# Patient Record
Sex: Male | Born: 1955 | Race: White | Hispanic: No | Marital: Married | Smoking: Never smoker
Health system: Southern US, Community
[De-identification: ages and names within clinical notes are randomized; demographics above are authoritative.]

## PROBLEM LIST (undated history)

## (undated) DIAGNOSIS — M1990 Unspecified osteoarthritis, unspecified site: Secondary | ICD-10-CM

## (undated) DIAGNOSIS — R55 Syncope and collapse: Secondary | ICD-10-CM

## (undated) DIAGNOSIS — T4145XA Adverse effect of unspecified anesthetic, initial encounter: Secondary | ICD-10-CM

## (undated) DIAGNOSIS — T8859XA Other complications of anesthesia, initial encounter: Secondary | ICD-10-CM

## (undated) DIAGNOSIS — G4762 Sleep related leg cramps: Secondary | ICD-10-CM

## (undated) DIAGNOSIS — I071 Rheumatic tricuspid insufficiency: Secondary | ICD-10-CM

## (undated) DIAGNOSIS — I5189 Other ill-defined heart diseases: Secondary | ICD-10-CM

## (undated) DIAGNOSIS — I10 Essential (primary) hypertension: Secondary | ICD-10-CM

## (undated) DIAGNOSIS — I519 Heart disease, unspecified: Secondary | ICD-10-CM

## (undated) HISTORY — PX: APPENDECTOMY: SHX54

## (undated) HISTORY — PX: KNEE ARTHROSCOPY: SUR90

## (undated) HISTORY — PX: HERNIA REPAIR: SHX51

## (undated) HISTORY — PX: TONSILLECTOMY: SUR1361

---

## 2005-10-06 ENCOUNTER — Ambulatory Visit (HOSPITAL_COMMUNITY): Admission: RE | Admit: 2005-10-06 | Discharge: 2005-10-06 | Payer: Self-pay | Admitting: *Deleted

## 2005-10-06 ENCOUNTER — Encounter (INDEPENDENT_AMBULATORY_CARE_PROVIDER_SITE_OTHER): Payer: Self-pay | Admitting: Specialist

## 2008-07-20 ENCOUNTER — Encounter (INDEPENDENT_AMBULATORY_CARE_PROVIDER_SITE_OTHER): Payer: Self-pay | Admitting: *Deleted

## 2008-07-20 ENCOUNTER — Ambulatory Visit (HOSPITAL_COMMUNITY): Admission: RE | Admit: 2008-07-20 | Discharge: 2008-07-20 | Payer: Self-pay | Admitting: *Deleted

## 2010-02-23 ENCOUNTER — Encounter: Admission: RE | Admit: 2010-02-23 | Discharge: 2010-02-23 | Payer: Self-pay | Admitting: Family Medicine

## 2010-08-18 IMAGING — CT CT ABD-PELV W/ CM
2 of 5 series · 17 of 46 positions shown, 19 images · IV contrast (30CC OMNI 300 & [ID] OMNI 350)
Comparison: None.

CLINICAL DATA: Right upper quadrant pain and right flank pain.
Rule out stone.

CT ABDOMEN AND PELVIS WITH CONTRAST
TECHNIQUE: Multidetector CT imaging of the abdomen and pelvis was
performed following the standard protocol during bolus
administration of intravenous contrast.
Contrast: 100 ml [DATE] and IV.

[Series 2: abdomen w/ · axial · 0.77mm/px · z∈[-420,-25]mm · 14 of 89 slices shown, 16 images]
[im 5/89  soft-tissue]
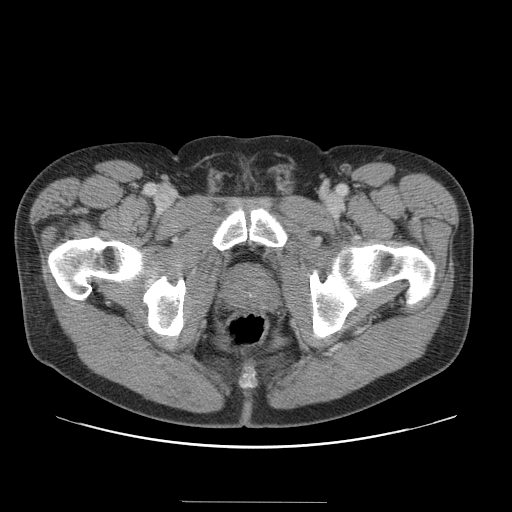
[im 5/89  bone]
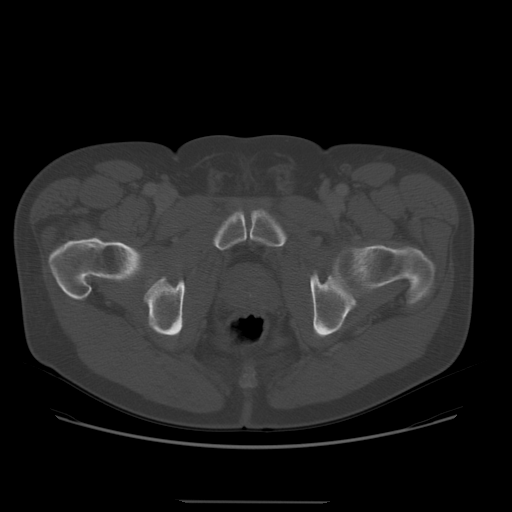
[im 10/89  soft-tissue]
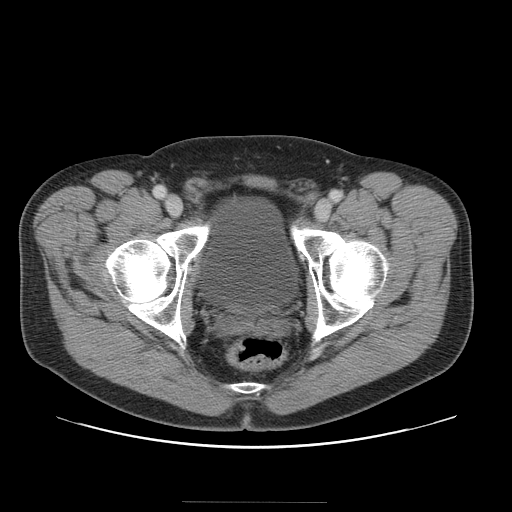
[im 19/89  soft-tissue]
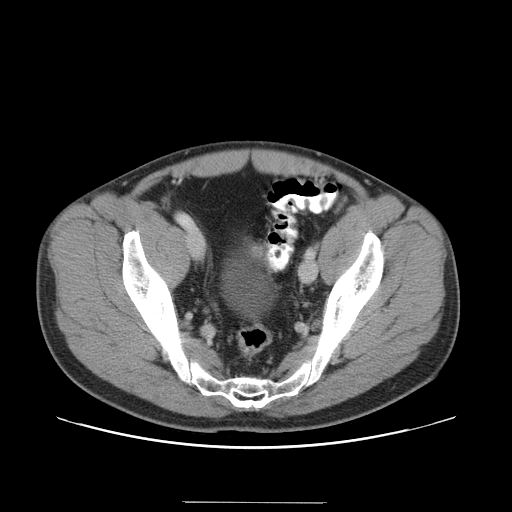
[im 24/89  soft-tissue]
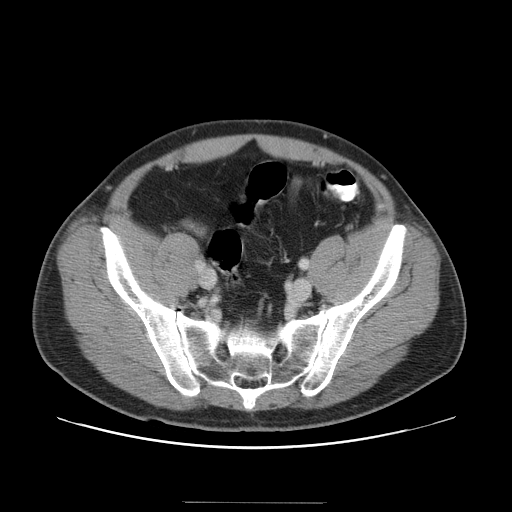
[im 28/89  soft-tissue]
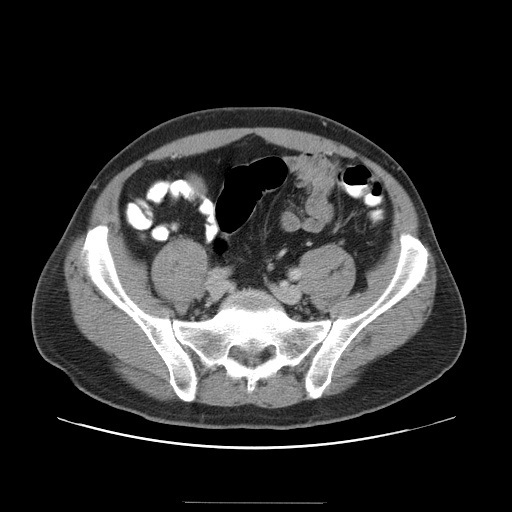
[im 38/89  soft-tissue]
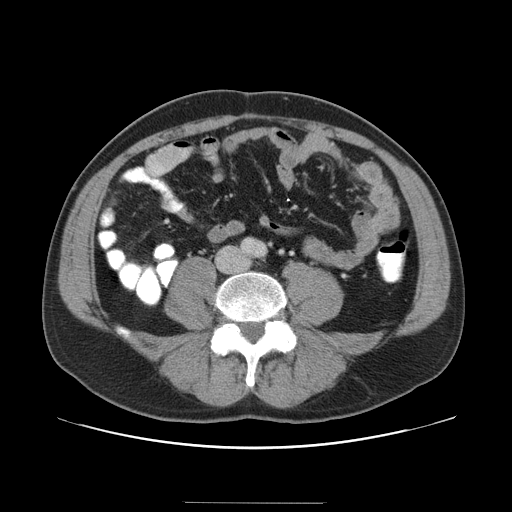
[im 42/89  soft-tissue]
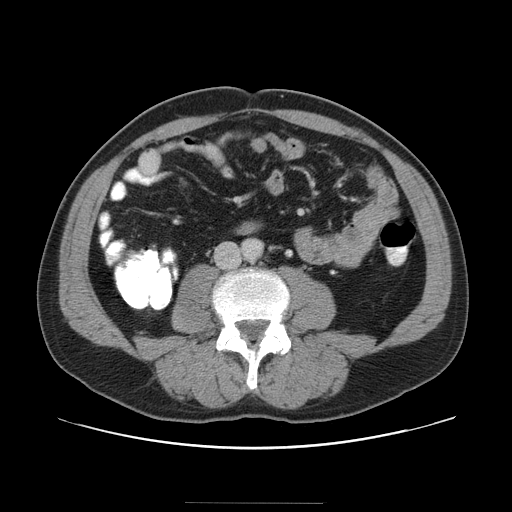
[im 47/89  soft-tissue]
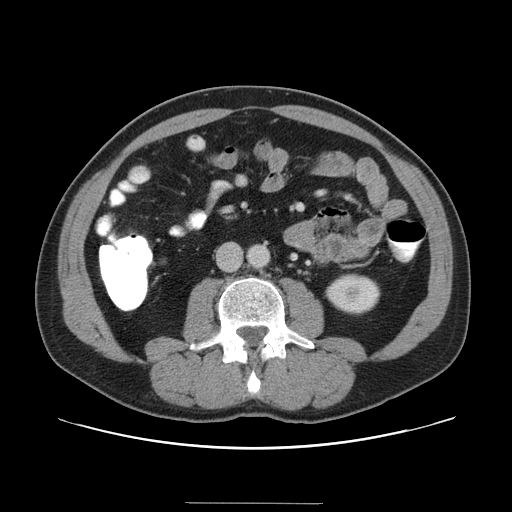
[im 51/89  soft-tissue]
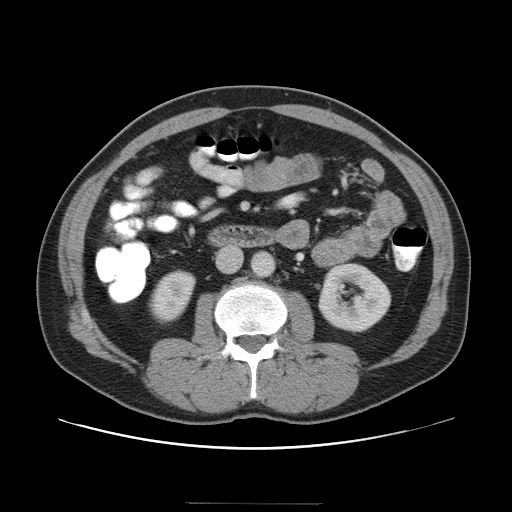
[im 51/89  bone]
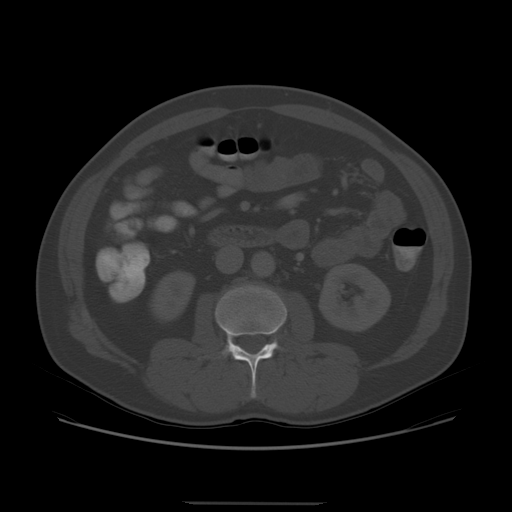
[im 61/89  soft-tissue]
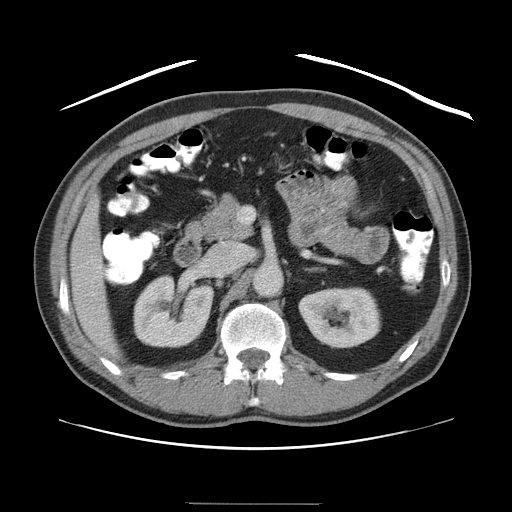
[im 65/89  soft-tissue]
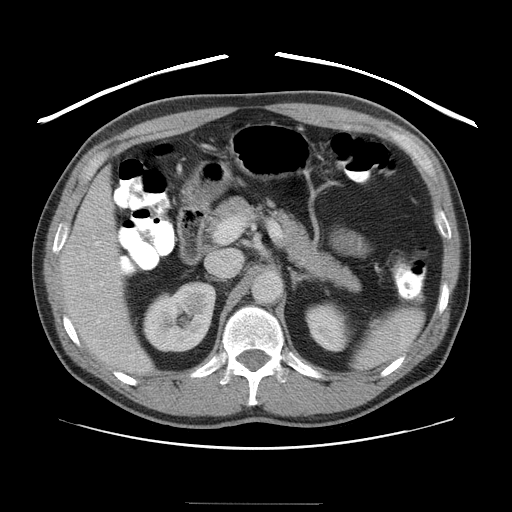
[im 70/89  soft-tissue]
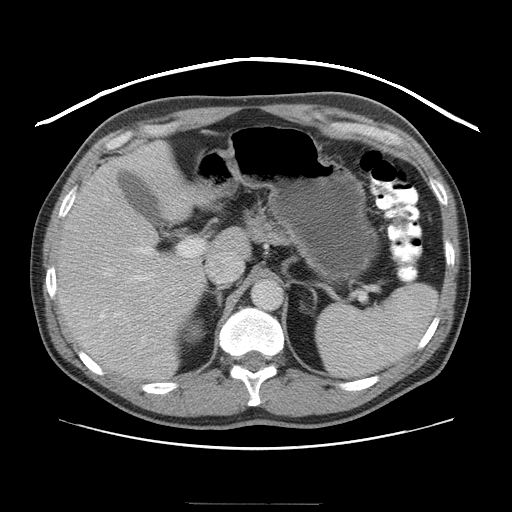
[im 79/89  soft-tissue]
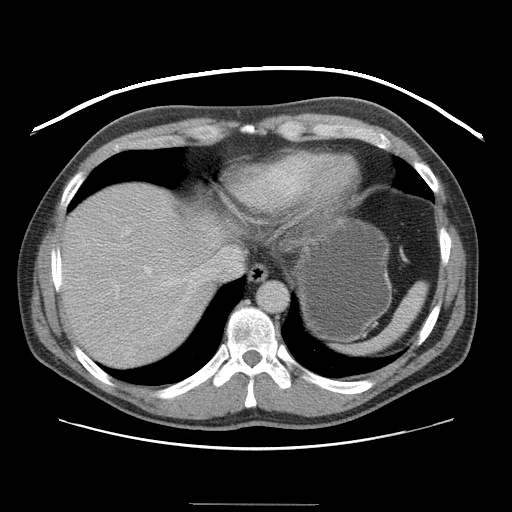
[im 84/89  soft-tissue]
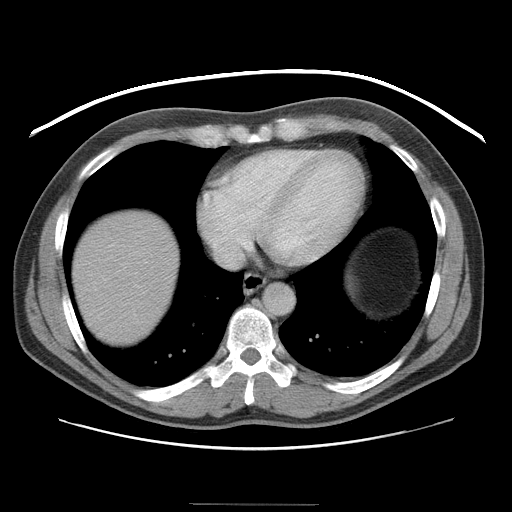

[Series 401: cor · coronal · 0.97mm/px · 3 of 116 slices shown]
[im 39/116  soft-tissue]
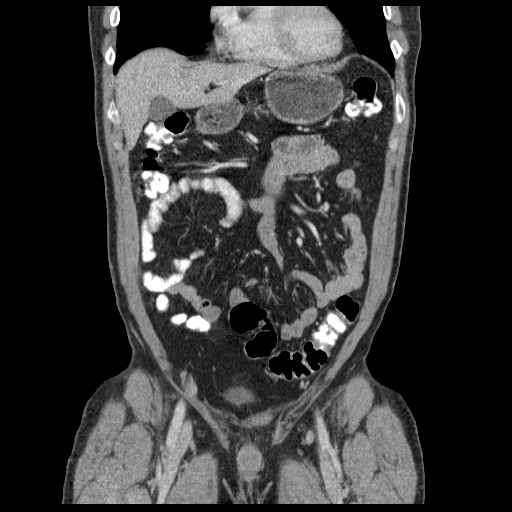
[im 52/116  soft-tissue]
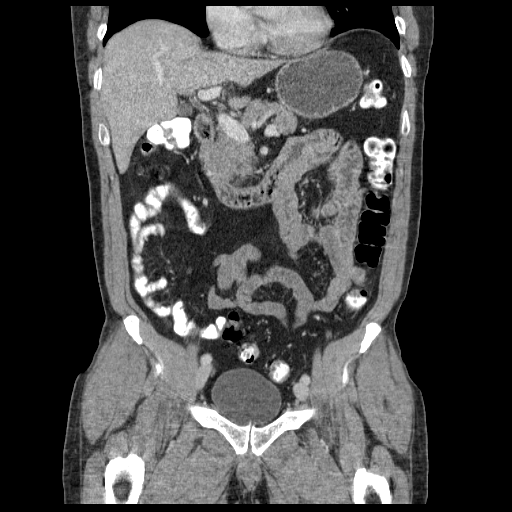
[im 64/116  soft-tissue]
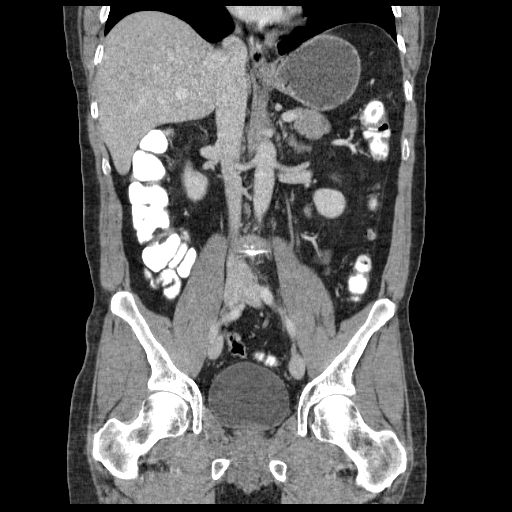

[17 of 46 positions shown; findings below may reference images not displayed]

FINDINGS: Unenhanced images were not obtained however no renal
calculi are identified.  There is no renal mass or obstruction.

The liver and gallbladder are normal.  Pancreas and spleen are
normal.  There is no bowel obstruction.  No mass or adenopathy is
present.  Mild sigmoid diverticulosis.  No acute bowel edema.
IMPRESSION: No acute abnormality.  No renal calculi or renal obstruction is
identified.

## 2010-12-04 HISTORY — PX: COLONOSCOPY: SHX174

## 2011-04-18 NOTE — Op Note (Signed)
Devin Campbell, Devin Campbell               ACCOUNT NO.:  [id number]   MEDICAL RECORD NO.:  [id number]          PATIENT TYPE:  AMB   LOCATION:  ENDO                         FACILITY:  [facility]   PHYSICIAN:  Georgiana Spinner, M.D.    DATE OF BIRTH:  10/10/1956   DATE OF PROCEDURE:  DATE OF DISCHARGE:                               OPERATIVE REPORT   PROCEDURE:  Colonoscopy.   INDICATIONS:  Colon cancer screening.   ANESTHESIA:  Fentanyl 125 mcg, Versed 10 mg.   PROCEDURE:  With the patient mildly sedated in the left lateral  decubitus position, a rectal exam was performed which was unremarkable.  Subsequently, the Pentax videoscopic colonoscope was inserted in the  rectum and passed under direct vision to the cecum, identified by  ileocecal valve and appendiceal orifice, both of which were  photographed.  From this point the colonoscope was slowly withdrawn  taking circumferential views of the colonic mucosa.  The prep was fairly  good.  There were some areas where there was vegetable material that had  to be suctioned as best we could, but no gross lesions were seen as we  withdrew all the way to the rectum and in the rectum was small polyp was  seen photographed, removed using hot biopsy forceps technique setting of  20/150 blended current.  The endoscope was then placed in retroflexion  to view the anal canal from above.  Internal hemorrhoids was seen and  photographed.  The endoscope was straightened and withdrawn.  The  patient's vital signs and pulse oximeter remained stable.  The patient  tolerated procedure well without apparent complication.   FINDINGS:  Internal hemorrhoids, very small polyp of rectum removed.  Await biopsy report.  The patient call me for results and follow-up with  me as needed as an outpatient.           ______________________________  Georgiana Spinner, M.D.     GMO/MEDQ  D:  07/20/2008  T:  07/20/2008  Job:  [id number]

## 2011-04-21 NOTE — Op Note (Signed)
NAMECATHAN, GEARIN               ACCOUNT NO.:  [id number]   MEDICAL RECORD NO.:  [id number]          PATIENT TYPE:  AMB   LOCATION:  ENDO                         FACILITY:  [facility]   PHYSICIAN:  Georgiana Spinner, M.D.    DATE OF BIRTH:  09-20-1956   DATE OF PROCEDURE:  10/06/2005  DATE OF DISCHARGE:                                 OPERATIVE REPORT   PROCEDURE:  Colonoscopy.   INDICATIONS:  Colon polyps.   ANESTHESIA:  Demerol 100, Versed 10 mg.   PROCEDURE:  The patient was mildly sedated in the left lateral decubitus  position, a rectal examination was performed which was unremarkable.  Subsequently, the Olympus videoscopic colonoscope was inserted into the  rectum and passed under direct vision. With pressure applied, we reached the  cecum identified by the ileocecal valve and appendiceal orifice both of  which were photographed. From this point, the colonoscope was slowly  withdrawn taking circumferential views of the colonic mucosa stopping to  photograph polyps first in the transverse colon which was removed using hot  biopsy forceps technique setting of 20/200 blended current. Next, we stopped  at approximately 20 cm from the anal verge which a second polyp was seen. It  too was photographed and it was removed using snare cautery technique with  the same setting. The tissue was suctioned into the colonoscope for  retrieval. The endoscope was then withdrawn to the rectum which appeared  normal on direct and retroflexed view. The endoscope was straightened and  withdrawn. The patient's vital signs and pulse oximeter remained stable. The  patient tolerated the procedure well without apparent complications.   FINDINGS:  Polyps as described above and transverse colon 20 cm from anal  verge, otherwise an unremarkable examination.   PLAN:  Await biopsy report. The patient will call me for results and follow-  up with me as an outpatient.           ______________________________  Georgiana Spinner, M.D.     GMO/MEDQ  D:  10/06/2005  T:  10/06/2005  Job:  [id number]

## 2015-06-04 DIAGNOSIS — R55 Syncope and collapse: Secondary | ICD-10-CM

## 2015-06-04 HISTORY — DX: Syncope and collapse: R55

## 2015-06-06 ENCOUNTER — Emergency Department (HOSPITAL_COMMUNITY): Payer: BLUE CROSS/BLUE SHIELD

## 2015-06-06 ENCOUNTER — Encounter (HOSPITAL_COMMUNITY): Payer: Self-pay | Admitting: Emergency Medicine

## 2015-06-06 ENCOUNTER — Emergency Department (HOSPITAL_COMMUNITY)
Admission: EM | Admit: 2015-06-06 | Discharge: 2015-06-06 | Disposition: A | Discharge status: Home / Self Care | Payer: BLUE CROSS/BLUE SHIELD | Attending: Emergency Medicine | Admitting: Emergency Medicine

## 2015-06-06 DIAGNOSIS — R55 Syncope and collapse: Secondary | ICD-10-CM | POA: Diagnosis not present

## 2015-06-06 DIAGNOSIS — Y9389 Activity, other specified: Secondary | ICD-10-CM | POA: Diagnosis not present

## 2015-06-06 DIAGNOSIS — W01198A Fall on same level from slipping, tripping and stumbling with subsequent striking against other object, initial encounter: Secondary | ICD-10-CM | POA: Diagnosis not present

## 2015-06-06 DIAGNOSIS — S8992XA Unspecified injury of left lower leg, initial encounter: Secondary | ICD-10-CM | POA: Insufficient documentation

## 2015-06-06 DIAGNOSIS — I10 Essential (primary) hypertension: Secondary | ICD-10-CM | POA: Diagnosis not present

## 2015-06-06 DIAGNOSIS — Y998 Other external cause status: Secondary | ICD-10-CM | POA: Diagnosis not present

## 2015-06-06 DIAGNOSIS — S0990XA Unspecified injury of head, initial encounter: Secondary | ICD-10-CM | POA: Insufficient documentation

## 2015-06-06 DIAGNOSIS — R42 Dizziness and giddiness: Secondary | ICD-10-CM | POA: Diagnosis not present

## 2015-06-06 DIAGNOSIS — S8991XA Unspecified injury of right lower leg, initial encounter: Secondary | ICD-10-CM | POA: Diagnosis not present

## 2015-06-06 DIAGNOSIS — R61 Generalized hyperhidrosis: Secondary | ICD-10-CM | POA: Insufficient documentation

## 2015-06-06 DIAGNOSIS — Y92002 Bathroom of unspecified non-institutional (private) residence single-family (private) house as the place of occurrence of the external cause: Secondary | ICD-10-CM | POA: Diagnosis not present

## 2015-06-06 HISTORY — DX: Essential (primary) hypertension: I10

## 2015-06-06 LAB — I-STAT TROPONIN, ED: Troponin i, poc: 0 ng/mL (ref 0.00–0.08)

## 2015-06-06 LAB — APTT: aPTT: 23 s — ABNORMAL LOW (ref 24–37)

## 2015-06-06 LAB — DIFFERENTIAL
Basophils Absolute: 0 K/uL (ref 0.0–0.1)
Basophils Relative: 0 % (ref 0–1)
Eosinophils Absolute: 0 K/uL (ref 0.0–0.7)
Eosinophils Relative: 1 % (ref 0–5)
Lymphocytes Relative: 19 % (ref 12–46)
Lymphs Abs: 0.8 K/uL (ref 0.7–4.0)
Monocytes Absolute: 0.2 K/uL (ref 0.1–1.0)
Monocytes Relative: 6 % (ref 3–12)
Neutro Abs: 3.3 K/uL (ref 1.7–7.7)
Neutrophils Relative %: 74 % (ref 43–77)

## 2015-06-06 LAB — CBC
HCT: 38.3 % — ABNORMAL LOW (ref 39.0–52.0)
Hemoglobin: 12.9 g/dL — ABNORMAL LOW (ref 13.0–17.0)
MCH: 29.4 pg (ref 26.0–34.0)
MCHC: 33.7 g/dL (ref 30.0–36.0)
MCV: 87.2 fL (ref 78.0–100.0)
Platelets: 228 10*3/uL (ref 150–400)
RBC: 4.39 MIL/uL (ref 4.22–5.81)
RDW: 14.8 % (ref 11.5–15.5)
WBC: 4.4 10*3/uL (ref 4.0–10.5)

## 2015-06-06 LAB — I-STAT CHEM 8, ED
BUN: 22 mg/dL — ABNORMAL HIGH (ref 6–20)
Calcium, Ion: 1.23 mmol/L (ref 1.12–1.23)
Chloride: 105 mmol/L (ref 101–111)
Creatinine, Ser: 0.9 mg/dL (ref 0.61–1.24)
Glucose, Bld: 101 mg/dL — ABNORMAL HIGH (ref 65–99)
HCT: 40 % (ref 39.0–52.0)
Hemoglobin: 13.6 g/dL (ref 13.0–17.0)
Potassium: 4.3 mmol/L (ref 3.5–5.1)
Sodium: 141 mmol/L (ref 135–145)
TCO2: 23 mmol/L (ref 0–100)

## 2015-06-06 LAB — COMPREHENSIVE METABOLIC PANEL
ALT: 18 U/L (ref 17–63)
AST: 19 U/L (ref 15–41)
Albumin: 3.7 g/dL (ref 3.5–5.0)
Alkaline Phosphatase: 64 U/L (ref 38–126)
Anion gap: 7 (ref 5–15)
BUN: 16 mg/dL (ref 6–20)
CO2: 26 mmol/L (ref 22–32)
Calcium: 8.7 mg/dL — ABNORMAL LOW (ref 8.9–10.3)
Chloride: 106 mmol/L (ref 101–111)
Creatinine, Ser: 1.01 mg/dL (ref 0.61–1.24)
GFR calc Af Amer: >60 mL/min (ref >60)
GFR calc non Af Amer: >60 mL/min (ref >60)
Glucose, Bld: 109 mg/dL — ABNORMAL HIGH (ref 65–99)
Potassium: 4.3 mmol/L (ref 3.5–5.1)
Sodium: 139 mmol/L (ref 135–145)
Total Bilirubin: 0.3 mg/dL (ref 0.3–1.2)
Total Protein: 6.1 g/dL — ABNORMAL LOW (ref 6.5–8.1)

## 2015-06-06 LAB — CBG MONITORING, ED: Glucose-Capillary: 96 mg/dL (ref 65–99)

## 2015-06-06 LAB — PROTIME-INR
INR: 0.99 (ref 0.00–1.49)
Prothrombin Time: 13.3 seconds (ref 11.6–15.2)

## 2015-06-06 NOTE — ED Provider Notes (Signed)
CSN: [id number]     Arrival date & time 06/06/15  1740 History   First MD Initiated Contact with Patient 06/06/15 1821     Chief Complaint  Patient presents with  . Loss of Consciousness     (Consider location/radiation/quality/duration/timing/severity/associated sxs/prior Treatment) HPI   59 year old male with history of hypertension brought here via EMS from home for evaluation of a syncopal episode. Patient reported earlier that day he was having cramping bloating sensation after eating [employer]. He went to use the bathroom to have a bowel movement. Patient denies having to strain and did not produce a BM.  While in the bathroom pt report feeling dizzy with room spinning sensation, became diaphoretic and when he tries to get up he fell forward striking head and knees against tile floor. Patient denies having a syncopal episode. Wife did came into assist him and patient states he fell to additional time while trying to get up. EMS subsequently came to assist him and brought him to the ER. Patient states he was off his normal self prior to the episode. He denies been outside in the sun or performing any strenuous activities. He denies any recent alcohol or street drug use. Aside from mild tenderness to his forehead from the fall and mild tenderness to bilateral knee, he denies having any significant headache, diplopia, facial numbness, chest pain, shortness of breath, lightheadedness, abdominal pain, back pain, focal numbness or weakness, or rash. He has never had similar episode like this before. No prior history of MI or stroke. His diet has been the same.  Past Medical History  Diagnosis Date  . Hypertension    Past Surgical History  Procedure Laterality Date  . Knee arthroscopy Left   . Tonsillectomy    . Hernia repair     No family history on file. History  Substance Use Topics  . Smoking status: Never Smoker   . Smokeless tobacco: Not on file  . Alcohol Use: No     Comment: socially     Review of Systems  All other systems reviewed and are negative.     Allergies  Review of patient's allergies indicates no known allergies.  Home Medications   Prior to Admission medications   Not on File   BP 118/70 mmHg  Pulse 67  Temp(Src) 98.8 F (37.1 C)  Resp 18  Ht 5\' 9"  (1.753 m)  Wt 185 lb (83.915 kg)  BMI 27.31 kg/m2  SpO2 98% Physical Exam  Constitutional: He is oriented to person, place, and time. He appears well-developed and well-nourished. No distress.  HENT:  Head: Atraumatic.  Right Ear: External ear normal.  Left Ear: External ear normal.  Nose: Nose normal.  Mouth/Throat: Oropharynx is clear and moist. No oropharyngeal exudate.  Mild tenderness to forehead. No hematoma to the back of head.  Eyes: Conjunctivae and EOM are normal. Pupils are equal, round, and reactive to light.  Neck: Normal range of motion. Neck supple.  No nuchal rigidity  Cardiovascular: Normal rate, regular rhythm and intact distal pulses.   Pulmonary/Chest: Effort normal and breath sounds normal.  Abdominal: Soft. There is no tenderness.  Musculoskeletal: He exhibits tenderness (Mild tenderness to the anterior knees bilaterally with normal knee flexion and extension and no gross deformity.). He exhibits no edema.  Neurological: He is alert and oriented to person, place, and time.  Neurologic exam:  Speech clear, pupils equal round reactive to light, extraocular movements intact  Normal peripheral visual fields Cranial nerves III through XII  normal including no facial droop Follows commands, moves all extremities x4, normal strength to bilateral upper and lower extremities at all major muscle groups including grip Sensation normal to light touch  Coordination intact, no limb ataxia, finger-nose-finger normal Rapid alternating movements normal No pronator drift Gait normal   Skin: No rash noted.  Psychiatric: He has a normal mood and affect.  Nursing note and vitals  reviewed.   ED Course  Procedures (including critical care time)  Patient here with a near syncopal episode along with dizziness. Suspect vasovagal. Workup initiated.  7:56 PM Patient has normal orthostatic vital sign. Mentating appropriately. Workup has been unremarkable. EKG without concerning arrhythmia. He is able to ambulate without assist. Does complaining of left anterior knee pain while ambulating. I do not think he has a acute fracture but I did offer x-ray, patient decline.  Orthostatic Lying  - BP- Lying: 117/76 mmHg ; Pulse- Lying: 80  Orthostatic Sitting - BP- Sitting: 119/74 mmHg ; Pulse- Sitting: 84  Orthostatic Standing at 0 minutes - BP- Standing at 0 minutes: 117/81 mmHg ; Pulse- Standing at 0 minutes: 88   8:10 PM Workup has been unremarkable. Patient felt comfortable going home. Return precautions discussed. Care discussed with DR. Ralene Bathe.  Labs Review Labs Reviewed  APTT - Abnormal; Notable for the following:    aPTT 23 (*)    All other components within normal limits  CBC - Abnormal; Notable for the following:    Hemoglobin 12.9 (*)    HCT 38.3 (*)    All other components within normal limits  COMPREHENSIVE METABOLIC PANEL - Abnormal; Notable for the following:    Glucose, Bld 109 (*)    Calcium 8.7 (*)    Total Protein 6.1 (*)    All other components within normal limits  I-STAT CHEM 8, ED - Abnormal; Notable for the following:    BUN 22 (*)    Glucose, Bld 101 (*)    All other components within normal limits  PROTIME-INR  DIFFERENTIAL  I-STAT TROPOININ, ED  CBG MONITORING, ED    Imaging Review Ct Head (brain) Wo Contrast  06/06/2015   CLINICAL DATA:  Syncopal episode after tenting data bowel movement. Dizziness. Loss of consciousness. Trauma to back of head.  EXAM: CT HEAD WITHOUT CONTRAST  TECHNIQUE: Contiguous axial images were obtained from the base of the skull through the vertex without intravenous contrast.  COMPARISON:  None.  FINDINGS: No  significant extracranial soft tissue injury is evident. The calvarium is intact. The paranasal sinuses and mastoid air cells are clear.  No acute infarct, hemorrhage, or mass lesion is present. Ventricles are of normal size. No significant extra-axial fluid collection is present.  IMPRESSION: Negative CT of the head.   Electronically Signed   By: San Morelle M.D.   On: 06/06/2015 18:52     EKG Interpretation   Date/Time:  Sunday June 06 2015 17:49:22 EDT Ventricular Rate:  68 PR Interval:  143 QRS Duration: 92 QT Interval:  394 QTC Calculation: 419 R Axis:   61 Text Interpretation:  Sinus rhythm Confirmed by Hazle Coca [phone]) on  06/06/2015 6:54:16 PM      MDM   Final diagnoses:  Syncope, vasovagal    BP 118/70 mmHg  Pulse 67  Temp(Src) 98.8 F (37.1 C)  Resp 18  Ht 5\' 9"  (1.753 m)  Wt 185 lb (83.915 kg)  BMI 27.31 kg/m2  SpO2 98%  I have reviewed nursing notes and vital signs. I personally  viewed the imaging tests through PACS system and agrees with radiologist's intepretation I reviewed available ER/hospitalization records through the EMR     Domenic Moras, PA-C 06/06/15 2011  Quintella Reichert, MD 06/07/15 [phone]

## 2015-06-06 NOTE — Discharge Instructions (Signed)
Vasovagal Syncope, Adult °Syncope, commonly known as fainting, is a temporary loss of consciousness. It occurs when the blood flow to the brain is reduced. Vasovagal syncope (also called neurocardiogenic syncope) is a fainting spell in which the blood flow to the brain is reduced because of a sudden drop in heart rate and blood pressure. Vasovagal syncope occurs when the brain and the cardiovascular system (blood vessels) do not adequately communicate and respond to each other. This is the most common cause of fainting. It often occurs in response to fear or some other type of emotional or physical stress. The body has a reaction in which the heart starts beating too slowly or the blood vessels expand, reducing blood pressure. This type of fainting spell is generally considered harmless. However, injuries can occur if a person takes a sudden fall during a fainting spell.  °CAUSES  °Vasovagal syncope occurs when a person's blood pressure and heart rate decrease suddenly, usually in response to a trigger. Many things and situations can trigger an episode. Some of these include:  °· Pain.   °· Fear.   °· The sight of blood or medical procedures, such as blood being drawn from a vein.   °· Common activities, such as coughing, swallowing, stretching, or going to the bathroom.   °· Emotional stress.   °· Prolonged standing, especially in a warm environment.   °· Lack of sleep or rest.   °· Prolonged lack of food.   °· Prolonged lack of fluids.   °· Recent illness. °· The use of certain drugs that affect blood pressure, such as cocaine, alcohol, marijuana, inhalants, and opiates.   °SYMPTOMS  °Before the fainting episode, you may:  °· Feel dizzy or light headed.   °· Become pale. °· Sense that you are going to faint.   °· Feel like the room is spinning.   °· Have tunnel vision, only seeing directly in front of you.   °· Feel sick to your stomach (nauseous).   °· See spots or slowly lose vision.   °· Hear ringing in your  ears.   °· Have a headache.   °· Feel warm and sweaty.   °· Feel a sensation of pins and needles. °During the fainting spell, you will generally be unconscious for no longer than a couple minutes before waking up and returning to normal. If you get up too quickly before your body can recover, you may faint again. Some twitching or jerky movements may occur during the fainting spell.  °DIAGNOSIS  °Your caregiver will ask about your symptoms, take a medical history, and perform a physical exam. Various tests may be done to rule out other causes of fainting. These may include blood tests and tests to check the heart, such as electrocardiography, echocardiography, and possibly an electrophysiology study. When other causes have been ruled out, a test may be done to check the body's response to changes in position (tilt table test). °TREATMENT  °Most cases of vasovagal syncope do not require treatment. Your caregiver may recommend ways to avoid fainting triggers and may provide home strategies for preventing fainting. If you must be exposed to a possible trigger, you can drink additional fluids to help reduce your chances of having an episode of vasovagal syncope. If you have warning signs of an oncoming episode, you can respond by positioning yourself favorably (lying down). °If your fainting spells continue, you may be given medicines to prevent fainting. Some medicines may help make you more resistant to repeated episodes of vasovagal syncope. Special exercises or compression stockings may be recommended. In rare cases, the surgical placement   of a pacemaker is considered. °HOME CARE INSTRUCTIONS  °· Learn to identify the warning signs of vasovagal syncope.   °· Sit or lie down at the first warning sign of a fainting spell. If sitting, put your head down between your legs. If you lie down, swing your legs up in the air to increase blood flow to the brain.   °· Avoid hot tubs and saunas. °· Avoid prolonged  standing. °· Drink enough fluids to keep your urine clear or pale yellow. Avoid caffeine. °· Increase salt in your diet as directed by your caregiver.   °· If you have to stand for a long time, perform movements such as:   °¨ Crossing your legs.   °¨ Flexing and stretching your leg muscles.   °¨ Squatting.   °¨ Moving your legs.   °¨ Bending over.   °· Only take over-the-counter or prescription medicines as directed by your caregiver. Do not suddenly stop any medicines without asking your caregiver first.  °SEEK MEDICAL CARE IF:  °· Your fainting spells continue or happen more frequently in spite of treatment.   °· You lose consciousness for more than a couple minutes. °· You have fainting spells during or after exercising or after being startled.   °· You have new symptoms that occur with the fainting spells, such as:   °¨ Shortness of breath. °¨ Chest pain.   °¨ Irregular heartbeat.   °· You have episodes of twitching or jerky movements that last longer than a few seconds. °· You have episodes of twitching or jerky movements without obvious fainting. °SEEK IMMEDIATE MEDICAL CARE IF:  °· You have injuries or bleeding after a fainting spell.   °· You have episodes of twitching or jerky movements that last longer than 5 minutes.   °· You have more than one spell of twitching or jerky movements before returning to consciousness after fainting. °MAKE SURE YOU:  °· Understand these instructions. °· Will watch your condition. °· Will get help right away if you are not doing well or get worse. °Document Released: 11/06/2012 Document Reviewed: 11/06/2012 °[employer]® Patient Information ©2015 [employer]. This information is not intended to replace advice given to you by your health care provider. Make sure you discuss any questions you have with your health care provider. ° °

## 2015-06-06 NOTE — ED Notes (Signed)
To ED via [facility] from home with c/o syncopal episode in bathroom after having some cramping/bloating and trying to have a bowel movement. Was not straining, when pt stood up, became dizzy, lost consciousness, when pt tried to stand , passed out again. C/o hematoma on back of head, and left knee. IV started in left forearm 20G per EMS.

## 2015-11-08 ENCOUNTER — Ambulatory Visit
Admission: RE | Admit: 2015-11-08 | Discharge: 2015-11-08 | Disposition: A | Discharge status: Home / Self Care | Payer: BLUE CROSS/BLUE SHIELD | Source: Ambulatory Visit | Attending: Internal Medicine | Admitting: Internal Medicine

## 2015-11-08 ENCOUNTER — Other Ambulatory Visit: Payer: Self-pay | Admitting: Internal Medicine

## 2015-11-08 DIAGNOSIS — M542 Cervicalgia: Secondary | ICD-10-CM

## 2015-11-18 ENCOUNTER — Encounter (HOSPITAL_COMMUNITY): Payer: Self-pay | Admitting: Radiology

## 2015-11-18 ENCOUNTER — Other Ambulatory Visit (HOSPITAL_COMMUNITY): Payer: Self-pay | Admitting: Internal Medicine

## 2015-11-18 DIAGNOSIS — R011 Cardiac murmur, unspecified: Secondary | ICD-10-CM

## 2015-11-18 NOTE — Progress Notes (Signed)
Patient was scheduled for an echo appointment for Dec 23 after the office is closed.  I called the home phone number with no answer will call cell phone.

## 2015-11-24 ENCOUNTER — Ambulatory Visit (HOSPITAL_COMMUNITY)
Admission: RE | Admit: 2015-11-24 | Discharge: 2015-11-24 | Disposition: A | Discharge status: Home / Self Care | Payer: BLUE CROSS/BLUE SHIELD | Source: Ambulatory Visit | Attending: Internal Medicine | Admitting: Internal Medicine

## 2015-11-24 DIAGNOSIS — R011 Cardiac murmur, unspecified: Secondary | ICD-10-CM

## 2015-11-26 ENCOUNTER — Other Ambulatory Visit (HOSPITAL_COMMUNITY): Payer: BLUE CROSS/BLUE SHIELD

## 2016-02-25 ENCOUNTER — Encounter (HOSPITAL_BASED_OUTPATIENT_CLINIC_OR_DEPARTMENT_OTHER): Payer: Self-pay | Admitting: *Deleted

## 2016-02-25 ENCOUNTER — Emergency Department (HOSPITAL_BASED_OUTPATIENT_CLINIC_OR_DEPARTMENT_OTHER)
Admission: EM | Admit: 2016-02-25 | Discharge: 2016-02-25 | Disposition: A | Discharge status: Home / Self Care | Payer: BLUE CROSS/BLUE SHIELD | Attending: Emergency Medicine | Admitting: Emergency Medicine

## 2016-02-25 DIAGNOSIS — I10 Essential (primary) hypertension: Secondary | ICD-10-CM | POA: Diagnosis not present

## 2016-02-25 DIAGNOSIS — Z79899 Other long term (current) drug therapy: Secondary | ICD-10-CM | POA: Insufficient documentation

## 2016-02-25 DIAGNOSIS — R51 Headache: Secondary | ICD-10-CM | POA: Diagnosis present

## 2016-02-25 DIAGNOSIS — H6123 Impacted cerumen, bilateral: Secondary | ICD-10-CM | POA: Diagnosis not present

## 2016-02-25 DIAGNOSIS — J0100 Acute maxillary sinusitis, unspecified: Secondary | ICD-10-CM | POA: Diagnosis not present

## 2016-02-25 MED ORDER — AMOXICILLIN-POT CLAVULANATE 875-125 MG PO TABS: 1.0000 | ORAL_TABLET | Freq: Two times a day (BID) | ORAL | Status: DC

## 2016-02-25 MED FILL — AMOX-CLAV 875-125 MG TABLET: 875-125 | 7 days supply | Qty: 14 | Fill #0

## 2016-02-25 NOTE — ED Notes (Signed)
Patient c/o sinus pressure and pain x 10 days. Patient reports significant phlegm production, and coughing.

## 2016-02-25 NOTE — ED Notes (Signed)
Per pt report over week sinus pressure and pain with fever. This morning bloody nose , not bleeding currently.

## 2016-02-25 NOTE — Discharge Instructions (Signed)

## 2016-02-25 NOTE — ED Provider Notes (Signed)
CSN: [id number]     Arrival date & time 02/25/16  1204 History   First MD Initiated Contact with Patient 02/25/16 1225     Chief Complaint  Patient presents with  . Facial Pain  . sinus pressure and pain      (Consider location/radiation/quality/duration/timing/severity/associated sxs/prior Treatment) HPI   60 year old male who presents complaining of facial pain. Patient report for the past 10 days he has had formed stools sinus headache, sinus pressure, persistent nasal drainage with green discharge and occasionally bloody nose when he blows his nose. He also reports subjective fever and chills several days ago. He has occasional sneezing. He denies any vision changes, ear pain, hearing changes, sore throat, neck pain, chest pain, shortness of breath. He has tried over-the-counter medication without adequate improvement. There point that his symptom is moderate in severity at this time. He does not have any history of sinusitis and he is not a smoker. No history of seasonal allergies.  Past Medical History  Diagnosis Date  . Hypertension    Past Surgical History  Procedure Laterality Date  . Knee arthroscopy Left   . Tonsillectomy    . Hernia repair     History reviewed. No pertinent family history. Social History  Substance Use Topics  . Smoking status: Never Smoker   . Smokeless tobacco: None  . Alcohol Use: No     Comment: socially    Review of Systems  Constitutional: Negative for fever.  HENT: Positive for postnasal drip and sinus pressure. Negative for dental problem, ear discharge, ear pain, sore throat and voice change.   Skin: Negative for rash and wound.      Allergies  Review of patient's allergies indicates no known allergies.  Home Medications   Prior to Admission medications   Medication Sig Start Date End Date Taking? Authorizing Provider  Cholecalciferol (VITAMIN D3) 2000 UNITS capsule Take 2,000 Units by mouth daily.   Yes Historical Provider, MD   lisinopril (PRINIVIL,ZESTRIL) 20 MG tablet Take 20 mg by mouth daily.   Yes Historical Provider, MD   BP 118/80 mmHg  Pulse 76  Temp(Src) 98.1 F (36.7 C) (Oral)  Resp 20  Ht 5\' 9"  (1.753 m)  Wt 82.555 kg  BMI 26.86 kg/m2  SpO2 100% Physical Exam  Constitutional: He is oriented to person, place, and time. He appears well-developed and well-nourished. No distress.  Well-appearing Caucasian male in no acute discomfort.  HENT:  Head: Atraumatic.  Ears: Cerumen impaction to both ears Nose: Normal nares Throat: Uvula is midline no tonsillar enlargement or exudates, no trismus Face: Mild tenderness to frontal and maxillary region to percussion.  Eyes: Conjunctivae are normal.  Neck: Normal range of motion. Neck supple.  Cardiovascular: Normal rate and regular rhythm.   Pulmonary/Chest: Effort normal and breath sounds normal.  Abdominal: Soft. There is no tenderness.  Lymphadenopathy:    He has no cervical adenopathy.  Neurological: He is alert and oriented to person, place, and time.  Skin: No rash noted.  Psychiatric: He has a normal mood and affect.  Nursing note and vitals reviewed.   ED Course  Procedures (including critical care time) Labs Review Labs Reviewed - No data to display  Imaging Review No results found. I have personally reviewed and evaluated these images and lab results as part of my medical decision-making.   EKG Interpretation None      MDM   Final diagnoses:  Acute maxillary sinusitis, recurrence not specified    BP 118/80 mmHg  Pulse 76  Temp(Src) 98.1 F (36.7 C) (Oral)  Resp 20  Ht 5\' 9"  (1.753 m)  Wt 82.555 kg  BMI 26.86 kg/m2  SpO2 100%   1:10 PM Patient presents with persistent sinus headache, nasal drainage for 10 days suggestive of acute sinusitis. He endorses examination is unremarkable. I will provide Augmentin as treatment. Follow-up with PCP as needed.  Domenic Moras, PA-C 02/25/16 [place], MD 02/25/16 1504

## 2016-02-25 NOTE — ED Notes (Signed)
Directed to pharmacy to pick up prescription 

## 2017-12-12 DIAGNOSIS — M25561 Pain in right knee: Secondary | ICD-10-CM | POA: Insufficient documentation

## 2018-04-04 DIAGNOSIS — M1712 Unilateral primary osteoarthritis, left knee: Secondary | ICD-10-CM | POA: Insufficient documentation

## 2018-04-04 DIAGNOSIS — M1711 Unilateral primary osteoarthritis, right knee: Secondary | ICD-10-CM | POA: Insufficient documentation

## 2018-06-07 ENCOUNTER — Encounter (HOSPITAL_COMMUNITY): Payer: Self-pay

## 2018-06-07 NOTE — Patient Instructions (Addendum)
Your procedure is scheduled on: Thursday, June 20, 2018   Surgery Time:  11:05AM-1:35 PM   Report to [facility]  Entrance    Report to admitting at 8:30 AM   Call this number if you have problems the morning of surgery [phone]   Do not eat food or drink liquids :After Midnight.   Do NOT smoke after Midnight   Take these medicines the morning of surgery with A SIP OF WATER:  None                               You may not have any metal on your body including jewelry, and body piercings             Do not wear lotions, powders, perfumes/cologne, or deodorant                          Men may shave face and neck.   Do not bring valuables to the hospital. [place].   Contacts, dentures or bridgework may not be worn into surgery.   Leave suitcase in the car. After surgery it may be brought to your room.   Special Instructions: Bring a copy of your healthcare power of attorney and living will documents         the day of surgery if you haven't scanned them in before.              Please read over the following fact sheets you were given:  [facility] - Preparing for Surgery Before surgery, you can play an important role.  Because skin is not sterile, your skin needs to be as free of germs as possible.  You can reduce the number of germs on your skin by washing with CHG (chlorahexidine gluconate) soap before surgery.  CHG is an antiseptic cleaner which kills germs and bonds with the skin to continue killing germs even after washing. Please DO NOT use if you have an allergy to CHG or antibacterial soaps.  If your skin becomes reddened/irritated stop using the CHG and inform your nurse when you arrive at Short Stay. Do not shave (including legs and underarms) for at least 48 hours prior to the first CHG shower.  You may shave your face/neck.  Please follow these instructions carefully:  1.  Shower with CHG Soap  the night before surgery and the  morning of surgery.  2.  If you choose to wash your hair, wash your hair first as usual with your normal  shampoo.  3.  After you shampoo, rinse your hair and body thoroughly to remove the shampoo.                             4.  Use CHG as you would any other liquid soap.  You can apply chg directly to the skin and wash.  Gently with a scrungie or clean washcloth.  5.  Apply the CHG Soap to your body ONLY FROM THE NECK DOWN.   Do   not use on face/ open                           Wound or  open sores. Avoid contact with eyes, ears mouth and   genitals (private parts).                       Wash face,  Genitals (private parts) with your normal soap.             6.  Wash thoroughly, paying special attention to the area where your    surgery  will be performed.  7.  Thoroughly rinse your body with warm water from the neck down.  8.  DO NOT shower/wash with your normal soap after using and rinsing off the CHG Soap.                9.  Pat yourself dry with a clean towel.            10.  Wear clean pajamas.            11.  Place clean sheets on your bed the night of your first shower and do not  sleep with pets. Day of Surgery : Do not apply any lotions/deodorants the morning of surgery.  Please wear clean clothes to the hospital/surgery center.  FAILURE TO FOLLOW THESE INSTRUCTIONS MAY RESULT IN THE CANCELLATION OF YOUR SURGERY  PATIENT SIGNATURE_________________________________  NURSE SIGNATURE__________________________________  ________________________________________________________________________   Adam Phenix  An incentive spirometer is a tool that can help keep your lungs clear and active. This tool measures how well you are filling your lungs with each breath. Taking long deep breaths may help reverse or decrease the chance of developing breathing (pulmonary) problems (especially infection) following:  A long period of time when you are unable  to move or be active. BEFORE THE PROCEDURE   If the spirometer includes an indicator to show your best effort, your nurse or respiratory therapist will set it to a desired goal.  If possible, sit up straight or lean slightly forward. Try not to slouch.  Hold the incentive spirometer in an upright position. INSTRUCTIONS FOR USE  1. Sit on the edge of your bed if possible, or sit up as far as you can in bed or on a chair. 2. Hold the incentive spirometer in an upright position. 3. Breathe out normally. 4. Place the mouthpiece in your mouth and seal your lips tightly around it. 5. Breathe in slowly and as deeply as possible, raising the piston or the ball toward the top of the column. 6. Hold your breath for 3-5 seconds or for as long as possible. Allow the piston or ball to fall to the bottom of the column. 7. Remove the mouthpiece from your mouth and breathe out normally. 8. Rest for a few seconds and repeat Steps 1 through 7 at least 10 times every 1-2 hours when you are awake. Take your time and take a few normal breaths between deep breaths. 9. The spirometer may include an indicator to show your best effort. Use the indicator as a goal to work toward during each repetition. 10. After each set of 10 deep breaths, practice coughing to be sure your lungs are clear. If you have an incision (the cut made at the time of surgery), support your incision when coughing by placing a pillow or rolled up towels firmly against it. Once you are able to get out of bed, walk around indoors and cough well. You may stop using the incentive spirometer when instructed by your caregiver.  RISKS AND COMPLICATIONS  Take your time so you  do not get dizzy or light-headed.  If you are in pain, you may need to take or ask for pain medication before doing incentive spirometry. It is harder to take a deep breath if you are having pain. AFTER USE  Rest and breathe slowly and easily.  It can be helpful to keep track  of a log of your progress. Your caregiver can provide you with a simple table to help with this. If you are using the spirometer at home, follow these instructions: Page IF:   You are having difficultly using the spirometer.  You have trouble using the spirometer as often as instructed.  Your pain medication is not giving enough relief while using the spirometer.  You develop fever of 100.5 F (38.1 C) or higher. SEEK IMMEDIATE MEDICAL CARE IF:   You cough up bloody sputum that had not been present before.  You develop fever of 102 F (38.9 C) or greater.  You develop worsening pain at or near the incision site. MAKE SURE YOU:   Understand these instructions.  Will watch your condition.  Will get help right away if you are not doing well or get worse. Document Released: 04/02/2007 Document Revised: 02/12/2012 Document Reviewed: 06/03/2007 ExitCare Patient Information 2014 Exit[employer], [place].   ________________________________________________________________________  WHAT IS A BLOOD TRANSFUSION? Blood Transfusion Information  A transfusion is the replacement of blood or some of its parts. Blood is made up of multiple cells which provide different functions.  Red blood cells carry oxygen and are used for blood loss replacement.  White blood cells fight against infection.  Platelets control bleeding.  Plasma helps clot blood.  Other blood products are available for specialized needs, such as hemophilia or other clotting disorders. BEFORE THE TRANSFUSION  Who gives blood for transfusions?   Healthy volunteers who are fully evaluated to make sure their blood is safe. This is blood bank blood. Transfusion therapy is the safest it has ever been in the practice of medicine. Before blood is taken from a donor, a complete history is taken to make sure that person has no history of diseases nor engages in risky social behavior (examples are intravenous drug use or  sexual activity with multiple partners). The donor's travel history is screened to minimize risk of transmitting infections, such as malaria. The donated blood is tested for signs of infectious diseases, such as HIV and hepatitis. The blood is then tested to be sure it is compatible with you in order to minimize the chance of a transfusion reaction. If you or a relative donates blood, this is often done in anticipation of surgery and is not appropriate for emergency situations. It takes many days to process the donated blood. RISKS AND COMPLICATIONS Although transfusion therapy is very safe and saves many lives, the main dangers of transfusion include:   Getting an infectious disease.  Developing a transfusion reaction. This is an allergic reaction to something in the blood you were given. Every precaution is taken to prevent this. The decision to have a blood transfusion has been considered carefully by your caregiver before blood is given. Blood is not given unless the benefits outweigh the risks. AFTER THE TRANSFUSION  Right after receiving a blood transfusion, you will usually feel much better and more energetic. This is especially true if your red blood cells have gotten low (anemic). The transfusion raises the level of the red blood cells which carry oxygen, and this usually causes an energy increase.  The nurse administering  the transfusion will monitor you carefully for complications. HOME CARE INSTRUCTIONS  No special instructions are needed after a transfusion. You may find your energy is better. Speak with your caregiver about any limitations on activity for underlying diseases you may have. SEEK MEDICAL CARE IF:   Your condition is not improving after your transfusion.  You develop redness or irritation at the intravenous (IV) site. SEEK IMMEDIATE MEDICAL CARE IF:  Any of the following symptoms occur over the next 12 hours:  Shaking chills.  You have a temperature by mouth above  102 F (38.9 C), not controlled by medicine.  Chest, back, or muscle pain.  People around you feel you are not acting correctly or are confused.  Shortness of breath or difficulty breathing.  Dizziness and fainting.  You get a rash or develop hives.  You have a decrease in urine output.  Your urine turns a dark color or changes to pink, red, or brown. Any of the following symptoms occur over the next 10 days:  You have a temperature by mouth above 102 F (38.9 C), not controlled by medicine.  Shortness of breath.  Weakness after normal activity.  The white part of the eye turns yellow (jaundice).  You have a decrease in the amount of urine or are urinating less often.  Your urine turns a dark color or changes to pink, red, or brown. Document Released: 11/17/2000 Document Revised: 02/12/2012 Document Reviewed: 07/06/2008 [facility] Patient Information 2014 [place], [place].  _______________________________________________________________________

## 2018-06-10 NOTE — H&P (Signed)
TOTAL KNEE ADMISSION H&P  Patient is being admitted for bilateral total knee arthroplasties.  Subjective:  Chief Complaint:     Bilateral knee primary OA / pain  HPI: Devin Campbell, 62 y.o. male, has a history of pain and functional disability in the bilateral knees due to arthritis and has failed non-surgical conservative treatments for greater than 12 weeks to include NSAID's and/or analgesics, corticosteriod injections and activity modification.  Onset of symptoms was gradual, starting 2+ years ago with gradually worsening course since that time. The patient noted prior procedures on the knee to include  arthroscopy and menisectomy on the left knee(s).  Patient currently rates pain in the bilateral knees at 9 out of 10 with activity. Patient has night pain, worsening of pain with activity and weight bearing, pain that interferes with activities of daily living, pain with passive range of motion, crepitus and joint swelling.  Patient has evidence of periarticular osteophytes and joint space narrowing by imaging studies.  There is no active infection.  Risks, benefits and expectations were discussed with the patient.  Risks including but not limited to the risk of anesthesia, blood clots, nerve damage, blood vessel damage, failure of the prosthesis, infection and up to and including death.  Patient understand the risks, benefits and expectations and wishes to proceed with surgery.   PCP: Jani Gravel, MD  D/C Plans:       Home   Post-op Meds:       No Rx given   Tranexamic Acid:      To be given - IV   Decadron:      Is to be given  FYI:     Xarelto then ASA  Oxycodone  DME:   Pt already has equipment  PT:   OPPT    Past Medical History:  Diagnosis Date  . Grade I diastolic dysfunction   . Hypertension   . Syncope 06/2015  . Trace tricuspid valve regurgitation     Past Surgical History:  Procedure Laterality Date  . COLONOSCOPY  2012  . HERNIA REPAIR    . KNEE ARTHROSCOPY Left    . TONSILLECTOMY      No current facility-administered medications for this encounter.    Current Outpatient Medications  Medication Sig Dispense Refill Last Dose  . lisinopril (PRINIVIL,ZESTRIL) 20 MG tablet Take 20 mg by mouth daily.   06/06/2015 at Unknown time  . Omega-3 Fatty Acids (FISH OIL) 1200 MG CAPS Take 1,200 mg by mouth daily.      No Known Allergies   Social History   Tobacco Use  . Smoking status: Never Smoker  Substance Use Topics  . Alcohol use: No    Comment: socially       Review of Systems  Constitutional: Negative.   HENT: Negative.   Eyes: Negative.   Respiratory: Negative.   Cardiovascular: Negative.   Gastrointestinal: Negative.   Genitourinary: Negative.   Musculoskeletal: Positive for joint pain.  Skin: Negative.   Neurological: Negative.   Endo/Heme/Allergies: Negative.   Psychiatric/Behavioral: Negative.     Objective:  Physical Exam  Constitutional: He is oriented to person, place, and time. He appears well-developed.  HENT:  Head: Normocephalic.  Eyes: Pupils are equal, round, and reactive to light.  Neck: Neck supple. No JVD present. No tracheal deviation present. No thyromegaly present.  Cardiovascular: Normal rate, regular rhythm and intact distal pulses.  Murmur heard. Respiratory: Effort normal and breath sounds normal. No respiratory distress. He has no wheezes.  GI: Soft. There is no tenderness. There is no guarding.  Musculoskeletal:       Right knee: He exhibits decreased range of motion, swelling and bony tenderness. He exhibits no ecchymosis, no deformity, no laceration and no erythema. Tenderness found.       Left knee: He exhibits decreased range of motion, swelling and bony tenderness. He exhibits no ecchymosis, no deformity, no laceration and no erythema. Tenderness found.  Lymphadenopathy:    He has no cervical adenopathy.  Neurological: He is alert and oriented to person, place, and time.  Skin: Skin is warm and dry.   Psychiatric: He has a normal mood and affect.      Labs:  Estimated body mass index is 26.88 kg/m as calculated from the following:   Height as of 02/25/16: 5\' 9"  (1.753 m).   Weight as of 02/25/16: 82.6 kg (182 lb).   Imaging Review Plain radiographs demonstrate severe degenerative joint disease of the bilateral knees.  The bone quality appears to be good for age and reported activity level.   Preoperative templating of the joint replacement has been completed, documented, and submitted to the Operating Room personnel in order to optimize intra-operative equipment management.   Anticipated LOS equal to or greater than 2 midnights due to - Age 58 and older with one or more of the following:  - Expected need for hospital services (PT, OT, Nursing) required for safe discharge  - Bilateral total knee arthroplasties     Assessment/Plan:  End stage arthritis, bilateral knees  The patient history, physical examination, clinical judgment of the provider and imaging studies are consistent with end stage degenerative joint disease of the bilateral knees and total knee arthroplasty is deemed medically necessary. The treatment options including medical management, injection therapy arthroscopy and arthroplasty were discussed at length. The risks and benefits of total knee arthroplasty were presented and reviewed. The risks due to aseptic loosening, infection, stiffness, patella tracking problems, thromboembolic complications and other imponderables were discussed. The patient acknowledged the explanation, agreed to proceed with the plan and consent was signed. Patient is being admitted for inpatient treatment for surgery, pain control, PT, OT, prophylactic antibiotics, VTE prophylaxis, progressive ambulation and ADL's and discharge planning. The patient is planning to be discharged home.     [place] Pugh Shyra Emile   PA-C  06/10/2018, 12:39 PM

## 2018-06-11 ENCOUNTER — Other Ambulatory Visit: Payer: Self-pay

## 2018-06-11 ENCOUNTER — Encounter (HOSPITAL_COMMUNITY)
Admission: RE | Admit: 2018-06-11 | Discharge: 2018-06-11 | Disposition: A | Discharge status: Home / Self Care | Payer: Commercial Managed Care - PPO | Source: Ambulatory Visit | Attending: Orthopedic Surgery | Admitting: Orthopedic Surgery

## 2018-06-11 ENCOUNTER — Encounter (HOSPITAL_COMMUNITY): Payer: Self-pay

## 2018-06-11 DIAGNOSIS — I10 Essential (primary) hypertension: Secondary | ICD-10-CM | POA: Insufficient documentation

## 2018-06-11 DIAGNOSIS — M170 Bilateral primary osteoarthritis of knee: Secondary | ICD-10-CM | POA: Insufficient documentation

## 2018-06-11 DIAGNOSIS — Z01812 Encounter for preprocedural laboratory examination: Secondary | ICD-10-CM | POA: Insufficient documentation

## 2018-06-11 HISTORY — DX: Other ill-defined heart diseases: I51.89

## 2018-06-11 HISTORY — DX: Syncope and collapse: R55

## 2018-06-11 HISTORY — DX: Unspecified osteoarthritis, unspecified site: M19.90

## 2018-06-11 HISTORY — DX: Adverse effect of unspecified anesthetic, initial encounter: T41.45XA

## 2018-06-11 HISTORY — DX: Other complications of anesthesia, initial encounter: T88.59XA

## 2018-06-11 HISTORY — DX: Rheumatic tricuspid insufficiency: I07.1

## 2018-06-11 HISTORY — DX: Heart disease, unspecified: I51.9

## 2018-06-11 HISTORY — DX: Sleep related leg cramps: G47.62

## 2018-06-11 LAB — BASIC METABOLIC PANEL
Anion gap: 8 (ref 5–15)
BUN: 22 mg/dL (ref 8–23)
CO2: 25 mmol/L (ref 22–32)
Calcium: 9.1 mg/dL (ref 8.9–10.3)
Chloride: 109 mmol/L (ref 98–111)
Creatinine, Ser: 1.03 mg/dL (ref 0.61–1.24)
GFR calc Af Amer: >60 mL/min (ref >60)
GFR calc non Af Amer: >60 mL/min (ref >60)
Glucose, Bld: 95 mg/dL (ref 70–99)
Potassium: 4.2 mmol/L (ref 3.5–5.1)
Sodium: 142 mmol/L (ref 135–145)

## 2018-06-11 LAB — CBC
HCT: 37.6 % — ABNORMAL LOW (ref 39.0–52.0)
Hemoglobin: 12.7 g/dL — ABNORMAL LOW (ref 13.0–17.0)
MCH: 30 pg (ref 26.0–34.0)
MCHC: 33.8 g/dL (ref 30.0–36.0)
MCV: 88.7 fL (ref 78.0–100.0)
Platelets: 272 10*3/uL (ref 150–400)
RBC: 4.24 MIL/uL (ref 4.22–5.81)
RDW: 13.7 % (ref 11.5–15.5)
WBC: 3.9 10*3/uL — ABNORMAL LOW (ref 4.0–10.5)

## 2018-06-11 LAB — SURGICAL PCR SCREEN
MRSA, PCR: NEGATIVE
Staphylococcus aureus: NEGATIVE

## 2018-06-11 NOTE — Pre-Procedure Instructions (Signed)
CBC results 06/11/2018 faxed to Dr. Alvan Dame via epic.

## 2018-06-12 LAB — ABO/RH: ABO/RH(D): O POS

## 2018-06-12 NOTE — Pre-Procedure Instructions (Addendum)
Surgical clearance Dr. Maudie Mercury 06/10/2018 in chart Last office visit note Dr, Maudie Mercury 05/15/2018 in chart

## 2018-06-19 MED ORDER — TRANEXAMIC ACID 1000 MG/10ML IV SOLN
1000.0000 mg | INTRAVENOUS | Status: AC
  Administered 2018-06-20: 1000 mg via INTRAVENOUS
  Filled 2018-06-19: qty 1100

## 2018-06-19 NOTE — Pre-Procedure Instructions (Signed)
Has attempted to call Dr. Irven Shelling office [phone] several times yesterday and today and can not get through. Still need EKG and Cardiac clearance for surgery.

## 2018-06-20 ENCOUNTER — Encounter (HOSPITAL_COMMUNITY): Payer: Self-pay | Admitting: *Deleted

## 2018-06-20 ENCOUNTER — Inpatient Hospital Stay (HOSPITAL_COMMUNITY): Payer: Commercial Managed Care - PPO | Admitting: Certified Registered Nurse Anesthetist

## 2018-06-20 ENCOUNTER — Encounter (HOSPITAL_COMMUNITY)
Admission: RE | Disposition: A | Discharge status: Home / Self Care | Payer: Self-pay | Source: Ambulatory Visit | Attending: Orthopedic Surgery

## 2018-06-20 ENCOUNTER — Inpatient Hospital Stay (HOSPITAL_COMMUNITY)
Admission: RE | Admit: 2018-06-20 | Discharge: 2018-06-22 | DRG: 462 | Disposition: A | Discharge status: Home / Self Care | Payer: Commercial Managed Care - PPO | Source: Ambulatory Visit | Attending: Orthopedic Surgery | Admitting: Orthopedic Surgery

## 2018-06-20 ENCOUNTER — Other Ambulatory Visit: Payer: Self-pay

## 2018-06-20 DIAGNOSIS — Z96653 Presence of artificial knee joint, bilateral: Secondary | ICD-10-CM

## 2018-06-20 DIAGNOSIS — M170 Bilateral primary osteoarthritis of knee: Secondary | ICD-10-CM | POA: Diagnosis present

## 2018-06-20 DIAGNOSIS — E669 Obesity, unspecified: Secondary | ICD-10-CM | POA: Diagnosis present

## 2018-06-20 DIAGNOSIS — Z6826 Body mass index (BMI) 26.0-26.9, adult: Secondary | ICD-10-CM

## 2018-06-20 DIAGNOSIS — I10 Essential (primary) hypertension: Secondary | ICD-10-CM | POA: Diagnosis present

## 2018-06-20 DIAGNOSIS — Z96659 Presence of unspecified artificial knee joint: Secondary | ICD-10-CM

## 2018-06-20 HISTORY — PX: TOTAL KNEE ARTHROPLASTY: SHX125

## 2018-06-20 LAB — TYPE AND SCREEN
ABO/RH(D): O POS
Antibody Screen: NEGATIVE

## 2018-06-20 SURGERY — ARTHROPLASTY, KNEE, BILATERAL, TOTAL
Anesthesia: Spinal | Site: Knee | Laterality: Bilateral

## 2018-06-20 MED ORDER — BUPIVACAINE-EPINEPHRINE (PF) 0.25% -1:200000 IJ SOLN
INTRAMUSCULAR | Status: DC | PRN
  Administered 2018-06-20 (×2): 15 mL

## 2018-06-20 MED ORDER — HYDROMORPHONE HCL 1 MG/ML IJ SOLN
0.5000 mg | INTRAMUSCULAR | Status: DC | PRN
  Administered 2018-06-20 – 2018-06-21 (×2): 1 mg via INTRAVENOUS
  Filled 2018-06-20 (×2): qty 1

## 2018-06-20 MED ORDER — FENTANYL CITRATE (PF) 100 MCG/2ML IJ SOLN
INTRAMUSCULAR | Status: AC
  Filled 2018-06-20: qty 2

## 2018-06-20 MED ORDER — PHENOL 1.4 % MT LIQD
1.0000 | OROMUCOSAL | Status: DC | PRN
  Filled 2018-06-20: qty 177

## 2018-06-20 MED ORDER — PROPOFOL 10 MG/ML IV BOLUS
INTRAVENOUS | Status: AC
Start: 2018-06-20 — End: ?
  Filled 2018-06-20: qty 20

## 2018-06-20 MED ORDER — SODIUM CHLORIDE 0.9 % IV SOLN
INTRAVENOUS | Status: DC
  Administered 2018-06-20 – 2018-06-21 (×2): via INTRAVENOUS

## 2018-06-20 MED ORDER — FERROUS SULFATE 325 (65 FE) MG PO TABS
325.0000 mg | ORAL_TABLET | Freq: Two times a day (BID) | ORAL | Status: DC
  Administered 2018-06-20 – 2018-06-22 (×4): 325 mg via ORAL
  Filled 2018-06-20 (×4): qty 1

## 2018-06-20 MED ORDER — OXYCODONE HCL 5 MG PO TABS: 5.0000 mg | ORAL_TABLET | ORAL | Status: DC | PRN

## 2018-06-20 MED ORDER — OXYCODONE HCL 5 MG PO TABS: 5.0000 mg | ORAL_TABLET | ORAL | 0 refills | Status: DC | PRN

## 2018-06-20 MED ORDER — SODIUM CHLORIDE 0.9 % IR SOLN
Status: DC | PRN
  Administered 2018-06-20: 1000 mL

## 2018-06-20 MED ORDER — CELECOXIB 200 MG PO CAPS
200.0000 mg | ORAL_CAPSULE | Freq: Two times a day (BID) | ORAL | Status: DC
  Administered 2018-06-20 – 2018-06-22 (×4): 200 mg via ORAL
  Filled 2018-06-20 (×4): qty 1

## 2018-06-20 MED ORDER — DOCUSATE SODIUM 100 MG PO CAPS
100.0000 mg | ORAL_CAPSULE | Freq: Two times a day (BID) | ORAL | Status: DC
  Administered 2018-06-20 – 2018-06-22 (×4): 100 mg via ORAL
  Filled 2018-06-20 (×4): qty 1

## 2018-06-20 MED ORDER — ONDANSETRON HCL 4 MG/2ML IJ SOLN
INTRAMUSCULAR | Status: DC | PRN
  Administered 2018-06-20: 4 mg via INTRAVENOUS

## 2018-06-20 MED ORDER — BUPIVACAINE-EPINEPHRINE (PF) 0.25% -1:200000 IJ SOLN
INTRAMUSCULAR | Status: AC
  Filled 2018-06-20: qty 30

## 2018-06-20 MED ORDER — FENTANYL CITRATE (PF) 100 MCG/2ML IJ SOLN
INTRAMUSCULAR | Status: DC | PRN
  Administered 2018-06-20: 100 ug via INTRAVENOUS

## 2018-06-20 MED ORDER — MIDAZOLAM HCL 2 MG/2ML IJ SOLN
INTRAMUSCULAR | Status: AC
  Administered 2018-06-20: 2 mg via INTRAVENOUS
  Filled 2018-06-20: qty 2

## 2018-06-20 MED ORDER — METOCLOPRAMIDE HCL 5 MG/ML IJ SOLN: 5.0000 mg | Freq: Three times a day (TID) | INTRAMUSCULAR | Status: DC | PRN

## 2018-06-20 MED ORDER — POLYETHYLENE GLYCOL 3350 17 G PO PACK: 17.0000 g | PACK | Freq: Two times a day (BID) | ORAL | 0 refills | Status: DC

## 2018-06-20 MED ORDER — ACETAMINOPHEN 500 MG PO TABS
1000.0000 mg | ORAL_TABLET | Freq: Four times a day (QID) | ORAL | Status: AC
  Administered 2018-06-20 – 2018-06-21 (×4): 1000 mg via ORAL
  Filled 2018-06-20 (×4): qty 2

## 2018-06-20 MED ORDER — CEFAZOLIN SODIUM-DEXTROSE 2-4 GM/100ML-% IV SOLN
2.0000 g | INTRAVENOUS | Status: AC
  Administered 2018-06-20: 2 g via INTRAVENOUS
  Filled 2018-06-20: qty 100

## 2018-06-20 MED ORDER — CEFAZOLIN SODIUM-DEXTROSE 2-4 GM/100ML-% IV SOLN
2.0000 g | Freq: Four times a day (QID) | INTRAVENOUS | Status: AC
  Administered 2018-06-20 (×2): 2 g via INTRAVENOUS
  Filled 2018-06-20 (×2): qty 100

## 2018-06-20 MED ORDER — PHENYLEPHRINE HCL 10 MG/ML IJ SOLN
INTRAMUSCULAR | Status: DC | PRN
  Administered 2018-06-20: 80 ug via INTRAVENOUS
  Administered 2018-06-20 (×3): 120 ug via INTRAVENOUS
  Administered 2018-06-20: 80 ug via INTRAVENOUS

## 2018-06-20 MED ORDER — METHOCARBAMOL 1000 MG/10ML IJ SOLN
500.0000 mg | Freq: Four times a day (QID) | INTRAVENOUS | Status: DC | PRN
  Administered 2018-06-20: 500 mg via INTRAVENOUS
  Filled 2018-06-20: qty 550

## 2018-06-20 MED ORDER — BUPIVACAINE HCL (PF) 0.75 % IJ SOLN
INTRAMUSCULAR | Status: DC | PRN
  Administered 2018-06-20: 2 mL via INTRATHECAL

## 2018-06-20 MED ORDER — BUPIVACAINE IN DEXTROSE 0.75-8.25 % IT SOLN
INTRATHECAL | Status: DC | PRN
  Administered 2018-06-20: 15 mg via INTRATHECAL

## 2018-06-20 MED ORDER — DOCUSATE SODIUM 100 MG PO CAPS: 100.0000 mg | ORAL_CAPSULE | Freq: Two times a day (BID) | ORAL | 0 refills | Status: DC

## 2018-06-20 MED ORDER — DIPHENHYDRAMINE HCL 12.5 MG/5ML PO ELIX: 12.5000 mg | ORAL_SOLUTION | ORAL | Status: DC | PRN

## 2018-06-20 MED ORDER — DEXAMETHASONE SODIUM PHOSPHATE 10 MG/ML IJ SOLN
10.0000 mg | Freq: Once | INTRAMUSCULAR | Status: AC
  Administered 2018-06-20: 10 mg via INTRAVENOUS

## 2018-06-20 MED ORDER — KETOROLAC TROMETHAMINE 30 MG/ML IJ SOLN
INTRAMUSCULAR | Status: DC | PRN
Start: 2018-06-20 — End: 2018-06-20
  Administered 2018-06-20 (×2): 15 mg

## 2018-06-20 MED ORDER — PROPOFOL 10 MG/ML IV BOLUS
INTRAVENOUS | Status: AC
  Filled 2018-06-20: qty 40

## 2018-06-20 MED ORDER — ONDANSETRON HCL 4 MG PO TABS: 4.0000 mg | ORAL_TABLET | Freq: Four times a day (QID) | ORAL | Status: DC | PRN

## 2018-06-20 MED ORDER — HYDROMORPHONE HCL 1 MG/ML IJ SOLN
INTRAMUSCULAR | Status: AC
  Administered 2018-06-20: 0.5 mg via INTRAVENOUS
  Filled 2018-06-20: qty 1

## 2018-06-20 MED ORDER — FENTANYL CITRATE (PF) 100 MCG/2ML IJ SOLN
50.0000 ug | INTRAMUSCULAR | Status: DC
  Administered 2018-06-20: 100 ug via INTRAVENOUS

## 2018-06-20 MED ORDER — MENTHOL 3 MG MT LOZG: 1.0000 | LOZENGE | OROMUCOSAL | Status: DC | PRN

## 2018-06-20 MED ORDER — RIVAROXABAN 10 MG PO TABS
10.0000 mg | ORAL_TABLET | ORAL | Status: DC
  Administered 2018-06-21 – 2018-06-22 (×2): 10 mg via ORAL
  Filled 2018-06-20 (×2): qty 1

## 2018-06-20 MED ORDER — CHLORHEXIDINE GLUCONATE 4 % EX LIQD: 60.0000 mL | Freq: Once | CUTANEOUS | Status: DC

## 2018-06-20 MED ORDER — METOCLOPRAMIDE HCL 5 MG PO TABS: 5.0000 mg | ORAL_TABLET | Freq: Three times a day (TID) | ORAL | Status: DC | PRN

## 2018-06-20 MED ORDER — ACETAMINOPHEN 500 MG PO TABS: 1000.0000 mg | ORAL_TABLET | Freq: Three times a day (TID) | ORAL | 0 refills | Status: DC

## 2018-06-20 MED ORDER — TRANEXAMIC ACID 1000 MG/10ML IV SOLN
1000.0000 mg | Freq: Once | INTRAVENOUS | Status: AC
  Administered 2018-06-20: 1000 mg via INTRAVENOUS
  Filled 2018-06-20: qty 1100

## 2018-06-20 MED ORDER — STERILE WATER FOR IRRIGATION IR SOLN
Status: DC | PRN
  Administered 2018-06-20: 2000 mL

## 2018-06-20 MED ORDER — LACTATED RINGERS IV SOLN
INTRAVENOUS | Status: DC
  Administered 2018-06-20 (×3): via INTRAVENOUS

## 2018-06-20 MED ORDER — FERROUS SULFATE 325 (65 FE) MG PO TABS: 325.0000 mg | ORAL_TABLET | Freq: Three times a day (TID) | ORAL | 3 refills | Status: DC

## 2018-06-20 MED ORDER — METHOCARBAMOL 500 MG PO TABS: 500.0000 mg | ORAL_TABLET | Freq: Four times a day (QID) | ORAL | 0 refills | Status: DC | PRN

## 2018-06-20 MED ORDER — KETOROLAC TROMETHAMINE 30 MG/ML IJ SOLN
INTRAMUSCULAR | Status: AC
  Filled 2018-06-20: qty 1

## 2018-06-20 MED ORDER — MAGNESIUM CITRATE PO SOLN: 1.0000 | Freq: Once | ORAL | Status: DC | PRN

## 2018-06-20 MED ORDER — RIVAROXABAN 10 MG PO TABS: 10.0000 mg | ORAL_TABLET | Freq: Every day | ORAL | 0 refills | Status: DC

## 2018-06-20 MED ORDER — MIDAZOLAM HCL 2 MG/2ML IJ SOLN: 0.5000 mg | Freq: Once | INTRAMUSCULAR | Status: DC | PRN

## 2018-06-20 MED ORDER — ALUM & MAG HYDROXIDE-SIMETH 200-200-20 MG/5ML PO SUSP: 15.0000 mL | ORAL | Status: DC | PRN

## 2018-06-20 MED ORDER — BISACODYL 10 MG RE SUPP: 10.0000 mg | Freq: Every day | RECTAL | Status: DC | PRN

## 2018-06-20 MED ORDER — DEXAMETHASONE SODIUM PHOSPHATE 10 MG/ML IJ SOLN
10.0000 mg | Freq: Once | INTRAMUSCULAR | Status: AC
  Administered 2018-06-21: 10 mg via INTRAVENOUS
  Filled 2018-06-20: qty 1

## 2018-06-20 MED ORDER — ASPIRIN 81 MG PO CHEW: 81.0000 mg | CHEWABLE_TABLET | Freq: Two times a day (BID) | ORAL | 0 refills | Status: AC

## 2018-06-20 MED ORDER — POLYETHYLENE GLYCOL 3350 17 G PO PACK
17.0000 g | PACK | Freq: Two times a day (BID) | ORAL | Status: DC
  Administered 2018-06-20 – 2018-06-22 (×4): 17 g via ORAL
  Filled 2018-06-20 (×4): qty 1

## 2018-06-20 MED ORDER — PROPOFOL 10 MG/ML IV BOLUS
INTRAVENOUS | Status: AC
  Filled 2018-06-20: qty 20

## 2018-06-20 MED ORDER — METHOCARBAMOL 500 MG PO TABS
500.0000 mg | ORAL_TABLET | Freq: Four times a day (QID) | ORAL | Status: DC | PRN
  Administered 2018-06-20 – 2018-06-22 (×3): 500 mg via ORAL
  Filled 2018-06-20 (×5): qty 1

## 2018-06-20 MED ORDER — SODIUM CHLORIDE 0.9 % IJ SOLN
INTRAMUSCULAR | Status: DC | PRN
  Administered 2018-06-20 (×2): 15 mL

## 2018-06-20 MED ORDER — MIDAZOLAM HCL 2 MG/2ML IJ SOLN
1.0000 mg | INTRAMUSCULAR | Status: DC
  Administered 2018-06-20: 2 mg via INTRAVENOUS

## 2018-06-20 MED ORDER — PHENYLEPHRINE 40 MCG/ML (10ML) SYRINGE FOR IV PUSH (FOR BLOOD PRESSURE SUPPORT)
PREFILLED_SYRINGE | INTRAVENOUS | Status: AC
  Filled 2018-06-20: qty 10

## 2018-06-20 MED ORDER — SODIUM CHLORIDE 0.9 % IV SOLN
INTRAVENOUS | Status: DC | PRN
  Administered 2018-06-20: 30 ug/min via INTRAVENOUS

## 2018-06-20 MED ORDER — MEPERIDINE HCL 50 MG/ML IJ SOLN: 6.2500 mg | INTRAMUSCULAR | Status: DC | PRN

## 2018-06-20 MED ORDER — SODIUM CHLORIDE 0.9 % IJ SOLN
INTRAMUSCULAR | Status: AC
  Filled 2018-06-20: qty 50

## 2018-06-20 MED ORDER — ONDANSETRON HCL 4 MG/2ML IJ SOLN: 4.0000 mg | Freq: Four times a day (QID) | INTRAMUSCULAR | Status: DC | PRN

## 2018-06-20 MED ORDER — SODIUM CHLORIDE 0.9 % IR SOLN
Status: DC | PRN
  Administered 2018-06-20 (×2): 1000 mL

## 2018-06-20 MED ORDER — ROPIVACAINE HCL 7.5 MG/ML IJ SOLN
INTRAMUSCULAR | Status: DC | PRN
  Administered 2018-06-20 (×2): 20 mL via PERINEURAL

## 2018-06-20 MED ORDER — OXYCODONE HCL 5 MG PO TABS
10.0000 mg | ORAL_TABLET | ORAL | Status: DC | PRN
  Administered 2018-06-20 – 2018-06-22 (×8): 10 mg via ORAL
  Filled 2018-06-20 (×8): qty 2

## 2018-06-20 MED ORDER — PROPOFOL 500 MG/50ML IV EMUL
INTRAVENOUS | Status: DC | PRN
  Administered 2018-06-20: 75 ug/kg/min via INTRAVENOUS

## 2018-06-20 MED ORDER — FENTANYL CITRATE (PF) 100 MCG/2ML IJ SOLN
INTRAMUSCULAR | Status: AC
  Administered 2018-06-20: 100 ug via INTRAVENOUS
  Filled 2018-06-20: qty 2

## 2018-06-20 MED ORDER — HYDROMORPHONE HCL 1 MG/ML IJ SOLN
0.2500 mg | INTRAMUSCULAR | Status: DC | PRN
Start: 2018-06-20 — End: 2018-06-20
  Administered 2018-06-20 (×4): 0.5 mg via INTRAVENOUS

## 2018-06-20 MED ORDER — PROMETHAZINE HCL 25 MG/ML IJ SOLN: 6.2500 mg | INTRAMUSCULAR | Status: DC | PRN

## 2018-06-20 SURGICAL SUPPLY — 57 items
ADH SKN CLS APL DERMABOND .7 (GAUZE/BANDAGES/DRESSINGS) ×2
BAG SPEC THK2 15X12 ZIP CLS (MISCELLANEOUS)
BAG ZIPLOCK 12X15 (MISCELLANEOUS) IMPLANT
BANDAGE ACE 6X5 VEL STRL LF (GAUZE/BANDAGES/DRESSINGS) ×4 IMPLANT
BANDAGE ESMARK 6X9 LF (GAUZE/BANDAGES/DRESSINGS) ×1 IMPLANT
BLADE SAW SGTL 11.0X1.19X90.0M (BLADE) IMPLANT
BLADE SAW SGTL 13.0X1.19X90.0M (BLADE) ×4 IMPLANT
BLADE SURG SZ10 CARB STEEL (BLADE) ×4 IMPLANT
BNDG CMPR 9X6 STRL LF SNTH (GAUZE/BANDAGES/DRESSINGS) ×1
BNDG COHESIVE 6X5 TAN STRL LF (GAUZE/BANDAGES/DRESSINGS) ×2 IMPLANT
BNDG ESMARK 6X9 LF (GAUZE/BANDAGES/DRESSINGS) ×2
BOWL SMART MIX CTS (DISPOSABLE) ×4 IMPLANT
CAPT KNEE TOTAL 3 ATTUNE ×1 IMPLANT
CEMENT HV SMART SET (Cement) ×4 IMPLANT
COVER SURGICAL LIGHT HANDLE (MISCELLANEOUS) ×2 IMPLANT
CUFF TOURN SGL QUICK 34 (TOURNIQUET CUFF) ×4
CUFF TRNQT CYL 34X4X40X1 (TOURNIQUET CUFF) ×2 IMPLANT
DECANTER SPIKE VIAL GLASS SM (MISCELLANEOUS) ×3 IMPLANT
DERMABOND ADVANCED (GAUZE/BANDAGES/DRESSINGS) ×2
DERMABOND ADVANCED .7 DNX12 (GAUZE/BANDAGES/DRESSINGS) ×2 IMPLANT
DRAPE EXTREMITY BILATERAL (DRAPES) ×2 IMPLANT
DRAPE INCISE IOBAN 66X45 STRL (DRAPES) ×2 IMPLANT
DRAPE U-SHAPE 47X51 STRL (DRAPES) ×8 IMPLANT
DRESSING AQUACEL AG SP 3.5X10 (GAUZE/BANDAGES/DRESSINGS) ×2 IMPLANT
DRSG AQUACEL AG SP 3.5X10 (GAUZE/BANDAGES/DRESSINGS) ×4
DURAPREP 26ML APPLICATOR (WOUND CARE) ×4 IMPLANT
ELECT CAUTERY BLADE 6.4 (BLADE) ×2 IMPLANT
ELECT CAUTERY BLADE TIP 2.5 (TIP) ×2
ELECT REM PT RETURN 15FT ADLT (MISCELLANEOUS) ×2 IMPLANT
ELECTRODE CAUTERY BLDE TIP 2.5 (TIP) ×1 IMPLANT
FACESHIELD WRAPAROUND (MASK) ×6 IMPLANT
FACESHIELD WRAPAROUND OR TEAM (MASK) ×3 IMPLANT
GLOVE BIOGEL M 7.0 STRL (GLOVE) IMPLANT
GLOVE BIOGEL PI IND STRL 7.5 (GLOVE) ×1 IMPLANT
GLOVE BIOGEL PI INDICATOR 7.5 (GLOVE) ×5
GLOVE ECLIPSE 8.5 STRL (GLOVE) ×6 IMPLANT
GLOVE ORTHO TXT STRL SZ7.5 (GLOVE) ×6 IMPLANT
GOWN STRL REUS W/TWL 2XL LVL3 (GOWN DISPOSABLE) ×3 IMPLANT
GOWN STRL REUS W/TWL LRG LVL3 (GOWN DISPOSABLE) ×3 IMPLANT
HANDPIECE INTERPULSE COAX TIP (DISPOSABLE) ×2
MANIFOLD NEPTUNE II (INSTRUMENTS) ×2 IMPLANT
NS IRRIG 1000ML POUR BTL (IV SOLUTION) ×2 IMPLANT
PACK TOTAL KNEE CUSTOM (KITS) ×2 IMPLANT
SET HNDPC FAN SPRY TIP SCT (DISPOSABLE) ×1 IMPLANT
SET PAD KNEE POSITIONER (MISCELLANEOUS) ×4 IMPLANT
SPONGE LAP 18X18 RF (DISPOSABLE) IMPLANT
STOCKINETTE 8 INCH (MISCELLANEOUS) ×2 IMPLANT
SUT MNCRL AB 4-0 PS2 18 (SUTURE) ×4 IMPLANT
SUT STRATAFIX 0 PDS 27 VIOLET (SUTURE) ×4
SUT VIC AB 1 CT1 36 (SUTURE) ×4 IMPLANT
SUT VIC AB 2-0 CT1 27 (SUTURE) ×8
SUT VIC AB 2-0 CT1 TAPERPNT 27 (SUTURE) ×4 IMPLANT
SUTURE STRATFX 0 PDS 27 VIOLET (SUTURE) ×2 IMPLANT
SYRINGE 60CC LL (MISCELLANEOUS) ×1 IMPLANT
TRAY FOLEY MTR SLVR 16FR STAT (SET/KITS/TRAYS/PACK) ×2 IMPLANT
WRAP KNEE MAXI GEL POST OP (GAUZE/BANDAGES/DRESSINGS) ×4 IMPLANT
YANKAUER SUCT BULB TIP 10FT TU (MISCELLANEOUS) ×1 IMPLANT

## 2018-06-20 NOTE — Anesthesia Postprocedure Evaluation (Signed)
Anesthesia Post Note  Patient: Devin Campbell  Procedure(s) Performed: TOTAL KNEE BILATERAL (Bilateral Knee)     Patient location during evaluation: PACU Anesthesia Type: Spinal and Regional Level of consciousness: awake and alert, oriented and patient cooperative Pain management: pain level controlled Vital Signs Assessment: post-procedure vital signs reviewed and stable Respiratory status: spontaneous breathing, nonlabored ventilation and respiratory function stable Cardiovascular status: blood pressure returned to baseline and stable Postop Assessment: spinal receding, no apparent nausea or vomiting and patient able to bend at knees Anesthetic complications: no    Last Vitals:  Vitals:   06/20/18 1300 06/20/18 1315  BP: 107/81 117/87  Pulse: 70 69  Resp: 13 14  Temp: 36.6 C 36.5 C  SpO2: 100% 98%    Last Pain:  Vitals:   06/20/18 1300  TempSrc:   PainSc: 2                  Suesan Mohrmann,E. Prince Olivier

## 2018-06-20 NOTE — Pre-Procedure Instructions (Signed)
Spoke with Orson Slick at Dr. Aurea Graff office 06/19/18 she was going to fax me the cardiac clearance from Dr. Einar Gip.  I left a message with Judeen Hammans this morning because we still have not received that fax.

## 2018-06-20 NOTE — Op Note (Signed)
NAME:  Devin Campbell [facility]                      MEDICAL RECORD NO.:  [id number]                             FACILITY:  [facility]      PHYSICIAN:  Pietro Cassis. Alvan Dame, M.D.  DATE OF BIRTH:  Oct 02, 1956      DATE OF PROCEDURE:  06/20/2018                                     OPERATIVE REPORT   This is a bilateral TKR case.  Below are templated knee reports to document sizes and procedure.  I began on the left knee and then started right knee upon closure of left.    Both knees were prepped and draped with [employer] leg holders used at same time.  Time outs were performed for each knee.     PREOPERATIVE DIAGNOSIS:  Bilateral knee osteoarthritis.      POSTOPERATIVE DIAGNOSIS:  Bilateral knee osteoarthritis.      FINDINGS:  The patient was noted to have complete loss of cartilage and   bone-on-bone arthritis with associated osteophytes in the medial and patellofemoral compartments of   the knee with a significant synovitis and associated effusion.  The patient had failed months of conservative treatment including medications, injection therapy, activity modification.     PROCEDURE:  Left total knee replacement.      COMPONENTS USED:  DePuy Attune rotating platform posterior stabilized knee   system, a size 6 femur, 7 tibia, size 7 mm PS AOX insert, and 38 anatomic patellar   button.      SURGEON:  Pietro Cassis. Alvan Dame, M.D.      ASSISTANT:  Danae Orleans, PA-C.      ANESTHESIA:  Regional and Spinal.      SPECIMENS:  None.      COMPLICATION:  None.      DRAINS:  None.  EBL: <100cc      TOURNIQUET TIME:  26 min at 212mmHg     The patient was stable to the recovery room.      INDICATION FOR PROCEDURE:  BREYON SIGG is a 62 y.o. male patient of   mine treated for bilateral knee osteoarthritis.  The patient had been seen, evaluated, and treated for months conservatively in the   office with medication, activity modification, and injections.  The patient had   radiographic changes of bone-on-bone  arthritis with endplate sclerosis and osteophytes noted.  Based on the radiographic changes and failed conservative measures, the patient   decided to proceed with definitive treatment, total knee replacement. We discussed risks and benefits specifically regarding single versus bilateral procedures based on available research data.  Risks of infection, DVT, component failure, need for revision surgery, neurovascular injury were reviewed in the office setting.  The postop course was reviewed stressing the efforts to maximize post-operative satisfaction and function.  Consent was obtained for benefit of pain   relief.      PROCEDURE IN DETAIL:  The patient was brought to the operative theater.   Once adequate anesthesia, preoperative antibiotics, 2 gm of Ancef,1 gm of Tranexamic Acid, and 10 mg of Decadron administered, the patient was positioned supine with a left thigh tourniquet placed.  The  left  lower extremity was prepped and draped in sterile fashion.  A time-   out was performed identifying the patient, planned procedure, and the appropriate extremity.      The both lower extremities were placed in the [facility] leg holders.  The left leg was   exsanguinated, tourniquet elevated to 250 mmHg.  A midline incision was   made followed by median parapatellar arthrotomy.  Following initial   exposure, attention was first directed to the patella.  Precut   measurement was noted to be 25 mm.  I resected down to 14 mm and used a   38 anatomic patellar button to restore patellar height as well as cover the cut surface.      The lug holes were drilled and a metal shim was placed to protect the   patella from retractors and saw blade during the procedure.      At this point, attention was now directed to the femur.  The femoral   canal was opened with a drill, irrigated to try to prevent fat emboli.  An   intramedullary rod was passed at 5 degrees valgus, 9 mm of bone was   resected off the distal  femur.  Following this resection, the tibia was   subluxated anteriorly.  Using the extramedullary guide, 2 mm of bone was resected off   the proximal medial tibia.  We confirmed the gap would be   stable medially and laterally with a size 5 spacer block as well as confirmed that the tibial cut was perpendicular in the coronal plane, checking with an alignment rod.      Once this was done, I sized the femur to be a size 6 in the anterior-   posterior dimension, chose a standard component based on medial and   lateral dimension.  The size 6 rotation block was then pinned in   position anterior referenced using the C-clamp to set rotation.  The   anterior, posterior, and  chamfer cuts were made without difficulty nor   notching making certain that I was along the anterior cortex to help   with flexion gap stability.      The final box cut was made off the lateral aspect of distal femur.      At this point, the tibia was sized to be a size 7.  The size 7 tray was   then pinned in position through the medial third of the tubercle,   drilled, and keel punched.  Trial reduction was now carried with a 6 femur,  7 tibia, a size 7 mm PS insert, and the 38 anatomic patella botton.  The knee was brought to full extension with good flexion stability with the patella   tracking through the trochlea without application of pressure.  Given   all these findings the trial components removed.  Final components were   opened and cement was mixed.  The knee was irrigated with normal saline solution and pulse lavage.  The synovial lining was   then injected with 1/2 of the combination of 30 cc of 0.25% Marcaine with epinephrine, 1 cc of Toradol and 30 cc of NS for a total of 30 cc.     Final implants were then cemented onto cleaned and dried cut surfaces of bone with the knee brought to extension with a size 7 mm PS trial insert.      Once the cement had fully cured, excess cement was removed   throughout the  knee.  I confirmed that I was satisfied with the range of   motion and stability, and the final size 7 mm PS AOX insert was chosen.  It was   placed into the knee.      The tourniquet had been let down at 26 minutes.  No significant   hemostasis was required.  The extensor mechanism was then reapproximated using #1 Vicryl and #1 Stratafix sutures with the knee   in flexion.  The   remaining wound was closed with 2-0 Vicryl and running 4-0 Monocryl.   The knee was cleaned, dried, dressed sterilely using Dermabond and   Aquacel dressing.  The patient was then   brought to recovery room in stable condition, tolerating the procedure   well.   Please note that Physician Assistant, Danae Orleans, PA-C was present for the entirety of the case, and was utilized for pre-operative positioning, peri-operative retractor management, general facilitation of the procedure and for primary wound closure at the end of the case.              Pietro Cassis Alvan Dame, M.D.    06/20/2018 9:22 AM  NAME:  [place] RECORD NO.:  [id number]                             FACILITY:  [facility]      PHYSICIAN:  Pietro Cassis. Alvan Dame, M.D.  DATE OF BIRTH:  May 28, 1956      DATE OF PROCEDURE:  06/20/2018                                     OPERATIVE REPORT         PREOPERATIVE DIAGNOSIS:  Bilateral knee osteoarthritis.      POSTOPERATIVE DIAGNOSIS:  Bilateral knee osteoarthritis.      FINDINGS:  The patient was noted to have complete loss of cartilage and   bone-on-bone arthritis with associated osteophytes in the medial and patellofemoral compartments of   the knee with a significant synovitis and associated effusion.  The patient had failed months of conservative treatment including medications, injection therapy, activity modification.     PROCEDURE:  Right total knee replacement.      COMPONENTS USED:  DePuy Attune rotating platform posterior stabilized knee   system, a size 6 femur, 7  tibia, size 8 mm PS AOX insert, and 38 anatomic patellar   button.      SURGEON:  Pietro Cassis. Alvan Dame, M.D.      ASSISTANT:  Danae Orleans, PA-C.      ANESTHESIA:  Regional and Spinal.      SPECIMENS:  None.      COMPLICATION:  None.      DRAINS:  None.  EBL: <100cc      TOURNIQUET TIME:  32 min at 250 mmHg     The patient was stable to the recovery room.      INDICATION FOR PROCEDURE:  DAY DEERY is a 62 y.o. male patient of   mine treated for bilateral knee osteoarthritis.  The patient had been seen, evaluated, and treated for months conservatively in the   office with medication, activity modification, and injections.  The patient had   radiographic changes of bone-on-bone arthritis with endplate  sclerosis and osteophytes noted.  Based on the radiographic changes and failed conservative measures, the patient   decided to proceed with definitive treatment, total knee replacement. We discussed risks and benefits specifically regarding single versus bilateral procedures based on available research data.  Risks of infection, DVT, component failure, need for revision surgery, neurovascular injury were reviewed in the office setting.  The postop course was reviewed stressing the efforts to maximize post-operative satisfaction and function.  Consent was obtained for benefit of pain   relief.       PROCEDURE IN DETAIL:  The patient was brought to the operative theater.   Once adequate anesthesia, preoperative antibiotics, 2 gm of Ancef,1 gm of Tranexamic Acid, and 10 mg of Decadron administered, the patient was positioned supine with a right thigh tourniquet placed.  The  right lower extremity was prepped and draped in sterile fashion.  A time-   out was performed identifying the patient, planned procedure, and the appropriate extremity.      The both lower extremities were placed in the [facility] leg holders.  The right leg was   exsanguinated, tourniquet elevated to 250 mmHg.  A midline  incision was   made followed by median parapatellar arthrotomy.  Following initial   exposure, attention was first directed to the patella.  Precut   measurement was noted to be 25 mm.  I resected down to 14 mm and used a   38 anatomic patellar button to restore patellar height as well as cover the cut surface.      The lug holes were drilled and a metal shim was placed to protect the   patella from retractors and saw blade during the procedure.      At this point, attention was now directed to the femur.  The femoral   canal was opened with a drill, irrigated to try to prevent fat emboli.  An   intramedullary rod was passed at 5 degrees valgus, 9 mm of bone was   resected off the distal femur.  Following this resection, the tibia was   subluxated anteriorly.  Using the extramedullary guide, 2 mm of bone was resected off   the proximal medial tibia.  We confirmed the gap would be   stable medially and laterally with a size 6 spacer block as well as confirmed that the tibial cut was perpendicular in the coronal plane, checking with an alignment rod.      Once this was done, I sized the femur to be a size 6 in the anterior-   posterior dimension, chose a standard component based on medial and   lateral dimension.  The size 6 rotation block was then pinned in   position anterior referenced using the C-clamp to set rotation.  The   anterior, posterior, and  chamfer cuts were made without difficulty nor   notching making certain that I was along the anterior cortex to help   with flexion gap stability.      The final box cut was made off the lateral aspect of distal femur.      At this point, the tibia was sized to be a size 7.  The size 7 tray was   then pinned in position through the medial third of the tubercle,   drilled, and keel punched.  Trial reduction was now carried with a 6 femur,  7 tibia, a size 6 then 8 mm PS insert, and the 38 anatomic patella botton.  The knee was  brought to  full extension with good flexion stability with the patella   tracking through the trochlea without application of pressure.  Given   all these findings the trial components removed.  Final components were   opened and cement was mixed.  The knee was irrigated with normal saline solution and pulse lavage.  The synovial lining was   then injected with the oth 1/2 of the combination of30 cc of 0.25% Marcaine with epinephrine, 1 cc of Toradol and 30 cc of NS for a total of 30 cc.     Final implants were then cemented onto cleaned and dried cut surfaces of bone with the knee brought to extension with a size 8 mm PS trial insert.      Once the cement had fully cured, excess cement was removed   throughout the knee.  I confirmed that I was satisfied with the range of   motion and stability, and the final size 8 mm PS AOX insert was chosen.  It was   placed into the knee.      The tourniquet had been let down at 32 minutes.  No significant   hemostasis was required.  The extensor mechanism was then reapproximated using #1 Vicryl and #1 Stratafix sutures with the knee   in flexion.  The   remaining wound was closed with 2-0 Vicryl and running 4-0 Monocryl.   The knee was cleaned, dried, dressed sterilely using Dermabond and   Aquacel dressing.  The patient was then   brought to recovery room in stable condition, tolerating the procedure   well.   Please note that Physician Assistant, Danae Orleans, PA-C was present for the entirety of the case, and was utilized for pre-operative positioning, peri-operative retractor management, general facilitation of the procedure and for primary wound closure at the end of the case.              Pietro Cassis Alvan Dame, M.D.    06/20/2018 9:22 AM

## 2018-06-20 NOTE — Interval H&P Note (Signed)
History and Physical Interval Note:  06/20/2018 7:31 AM  Devin Campbell Peter Garter  has presented today for surgery, with the diagnosis of Bilateral knee osteoarthritis  The various methods of treatment have been discussed with the patient and family. After consideration of risks, benefits and other options for treatment, the patient has consented to  Procedure(s) with comments: TOTAL KNEE BILATERAL (Bilateral) - 2.5 hrs as a surgical intervention .  The patient's history has been reviewed, patient examined, no change in status, stable for surgery.  I have reviewed the patient's chart and labs.  Questions were answered to the patient's satisfaction.     Mauri Pole

## 2018-06-20 NOTE — Anesthesia Procedure Notes (Addendum)
Anesthesia Regional Block: Adductor canal block   Pre-Anesthetic Checklist: ,, timeout performed, Correct Patient, Correct Site, Correct Laterality, Correct Procedure, Correct Position, site marked, Risks and benefits discussed,  Surgical consent,  Pre-op evaluation,  At surgeon's request and post-op pain management  Laterality: Lower and Left  Prep: chloraprep       Needles:  Injection technique: Single-shot  Needle Type: Echogenic Needle     Needle Length: 9cm  Needle Gauge: 21     Additional Needles:   Procedures:,,,, ultrasound used (permanent image in chart),,,,  Narrative:  Start time: 06/20/2018 9:00 AM End time: 06/20/2018 9:02 AM Injection made incrementally with aspirations every 5 mL.  Performed by: Personally  Anesthesiologist: Annye Asa, MD  Additional Notes: Pt identified in Holding room.  Monitors applied. Working IV access confirmed. Sterile prep, drape L thigh.  #21ga ECHOgenic needle into adductor canal with US guidance.  20cc 0.75% Ropivacaine injected incrementally after negative test dose.  Patient asymptomatic, VSS, no heme aspirated, tolerated well.  Jenita Seashore, MD

## 2018-06-20 NOTE — Evaluation (Signed)
Physical Therapy Evaluation Patient Details Name: Devin Campbell MRN: [id number] DOB: 09/09/1956 Today's Date: 06/20/2018   History of Present Illness  Pt s/p bil TKR and with hx of syncope including 4 vasovagal episodes in 3 yrs  Clinical Impression  Pt s/p bil TKR and presents with decreased bil LE strength/ROM and post op pain limiting functional mobility.  Pt should progress to dc home with family assist and plans to start OP PT Monday 06/24/18    Follow Up Recommendations Follow surgeon's recommendation for DC plan and follow-up therapies    Equipment Recommendations  None recommended by PT    Recommendations for Other Services OT consult     Precautions / Restrictions Precautions Precautions: Fall Restrictions Weight Bearing Restrictions: No Other Position/Activity Restrictions: WBAT      Mobility  Bed Mobility Overal bed mobility: Needs Assistance Bed Mobility: Supine to Sit     Supine to sit: Min assist     General bed mobility comments: cues for sequence, min guard bil LEs  Transfers Overall transfer level: Needs assistance Equipment used: Rolling walker (2 wheeled) Transfers: Sit to/from Stand Sit to Stand: Min assist;+2 physical assistance;From elevated surface         General transfer comment: cues for use of UEs to self assist and to walk legs fwd/bkwd to sit/stand  Ambulation/Gait Ambulation/Gait assistance: Min assist;+2 safety/equipment Gait Distance (Feet): 18 Feet Assistive device: Rolling walker (2 wheeled) Gait Pattern/deviations: Step-to pattern;Decreased step length - right;Decreased step length - left;Shuffle;Trunk flexed Gait velocity: decr   General Gait Details: cues for posture, sequence and position from [employer] Mobility    Modified Rankin (Stroke Patients Only)       Balance                                             Pertinent Vitals/Pain Pain Assessment: 0-10 Pain  Score: 6  Pain Location: bil knees Pain Descriptors / Indicators: Aching;Sore Pain Intervention(s): Limited activity within patient's tolerance;Monitored during session;Premedicated before session;Patient requesting pain meds-RN notified;Ice applied    Home Living Family/patient expects to be discharged to:: Private residence Living Arrangements: Spouse/significant other Available Help at Discharge: Family Type of Home: House Home Access: [place]: One [place]: Environmental consultant - 2 wheels;Bedside commode      Prior Function Level of Independence: Independent               Hand Dominance        Extremity/Trunk Assessment   Upper Extremity Assessment Upper Extremity Assessment: Overall WFL for tasks assessed    Lower Extremity Assessment Lower Extremity Assessment: RLE deficits/detail;LLE deficits/detail    Cervical / Trunk Assessment Cervical / Trunk Assessment: Normal  Communication   Communication: HOH  Cognition Arousal/Alertness: Awake/alert Behavior During Therapy: WFL for tasks assessed/performed Overall Cognitive Status: Within Functional Limits for tasks assessed                                        General Comments      Exercises Total Joint Exercises Ankle Circles/Pumps: AROM;Both;15 reps;Supine   Assessment/Plan    PT Assessment Patient needs continued PT services  PT Problem List Decreased strength;Decreased  range of motion;Decreased activity tolerance;Decreased mobility;Decreased knowledge of use of DME;Pain       PT Treatment Interventions DME instruction;Gait training;Functional mobility training;Therapeutic activities;Therapeutic exercise;Patient/family education    PT Goals (Current goals can be found in the Care Plan section)  Acute Rehab PT Goals Patient Stated Goal: Regain IND PT Goal Formulation: With patient Time For Goal Achievement: 06/27/18 Potential to Achieve Goals: Good     Frequency 7X/week   Barriers to discharge        Co-evaluation               AM-PAC PT "6 Clicks" Daily Activity  Outcome Measure Difficulty turning over in bed (including adjusting bedclothes, sheets and blankets)?: Unable Difficulty moving from lying on back to sitting on the side of the bed? : Unable Difficulty sitting down on and standing up from a chair with arms (e.g., wheelchair, bedside commode, etc,.)?: Unable Help needed moving to and from a bed to chair (including a wheelchair)?: A Lot Help needed walking in hospital room?: A Lot Help needed climbing 3-5 steps with a railing? : Total 6 Click Score: 8    End of Session Equipment Utilized During Treatment: Gait belt Activity Tolerance: Patient tolerated treatment well Patient left: in chair;with call bell/phone within reach;with family/visitor present Nurse Communication: Mobility status PT Visit Diagnosis: Difficulty in walking, not elsewhere classified (R26.2)    Time: 2330-0762 PT Time Calculation (min) (ACUTE ONLY): 28 min   Charges:   PT Evaluation $PT Eval Low Complexity: 1 Low PT Treatments $Gait Training: 8-22 mins   PT G Codes:        Pg [phone]   Bridgitte Felicetti 06/20/2018, 5:34 PM

## 2018-06-20 NOTE — Transfer of Care (Signed)
Immediate Anesthesia Transfer of Care Note  Patient: Devin Campbell  Procedure(s) Performed: TOTAL KNEE BILATERAL (Bilateral Knee)  Patient Location: PACU  Anesthesia Type:Spinal  Level of Consciousness: sedated, patient cooperative and responds to stimulation  Airway & Oxygen Therapy: Patient Spontanous Breathing and Patient connected to face mask oxygen  Post-op Assessment: Report given to RN and Post -op Vital signs reviewed and stable  Post vital signs: Reviewed and stable  Last Vitals:  Vitals Value Taken Time  BP 101/62 06/20/2018 11:55 AM  Temp    Pulse 65 06/20/2018 11:57 AM  Resp 16 06/20/2018 11:57 AM  SpO2 99 % 06/20/2018 11:57 AM  Vitals shown include unvalidated device data.  Last Pain:  Vitals:   06/20/18 0709  TempSrc:   PainSc: 3          Complications: No apparent anesthesia complications

## 2018-06-20 NOTE — Anesthesia Procedure Notes (Signed)
Anesthesia Regional Block: Adductor canal block   Pre-Anesthetic Checklist: ,, timeout performed, Correct Patient, Correct Site, Correct Laterality, Correct Procedure, Correct Position, site marked, Risks and benefits discussed,  Surgical consent,  Pre-op evaluation,  At surgeon's request and post-op pain management  Laterality: Right and Lower  Prep: chloraprep       Needles:  Injection technique: Single-shot  Needle Type: Echogenic Needle     Needle Length: 9cm  Needle Gauge: 21     Additional Needles:   Procedures:,,,, ultrasound used (permanent image in chart),,,,  Narrative:  Start time: 06/20/2018 8:54 AM End time: 06/20/2018 9:00 AM Injection made incrementally with aspirations every 5 mL.  Performed by: Personally  Anesthesiologist: Annye Asa, MD  Additional Notes: Pt identified in Holding room.  Monitors applied. Working IV access confirmed. Sterile prep, drape R thigh.  #21ga ECHOgenic needle into adductor canal with US guidance.  20cc 0.75% Ropivacaine injected incrementally after negative test dose.  Patient asymptomatic, VSS, no heme aspirated, tolerated well.  Jenita Seashore, MD

## 2018-06-20 NOTE — Progress Notes (Signed)
AssistedDr. Annye Asa with right, ultrasound guided, adductor canal block, left , ultrasound guided, adductor canal block.. Side rails up, monitors on throughout procedure. See vital signs in flow sheet. Tolerated Procedure well.

## 2018-06-20 NOTE — Anesthesia Preprocedure Evaluation (Addendum)
Anesthesia Evaluation  Patient identified by MRN, date of birth, ID band Patient awake    Reviewed: Allergy & Precautions, NPO status , Patient's Chart, lab work & pertinent test results  History of Anesthesia Complications Negative for: history of anesthetic complications  Airway Mallampati: II  TM Distance: >3 FB Neck ROM: Full    Dental  (+) Caps, Dental Advisory Given   Pulmonary neg pulmonary ROS,    breath sounds clear to auscultation       Cardiovascular hypertension, Pt. on medications (-) angina Rhythm:Regular Rate:Normal  '16 ECHO: Normal LV systolic function; grade 1 diastolic dysfunction; trace TR; mildly elevated pulmonary pressure   Neuro/Psych H/o vaso-vagal x3    GI/Hepatic negative GI ROS, Neg liver ROS,   Endo/Other  negative endocrine ROS  Renal/GU negative Renal ROS     Musculoskeletal  (+) Arthritis , Osteoarthritis,    Abdominal (+) - obese,   Peds  Hematology negative hematology ROS (+)   Anesthesia Other Findings   Reproductive/Obstetrics                            Anesthesia Physical Anesthesia Plan  ASA: II  Anesthesia Plan: Spinal   Post-op Pain Management:  Regional for Post-op pain   Induction:   PONV Risk Score and Plan: 1 and Ondansetron  Airway Management Planned: Natural Airway and Simple Face Mask  Additional Equipment:   Intra-op Plan:   Post-operative Plan:   Informed Consent: I have reviewed the patients History and Physical, chart, labs and discussed the procedure including the risks, benefits and alternatives for the proposed anesthesia with the patient or authorized representative who has indicated his/her understanding and acceptance.   Dental advisory given  Plan Discussed with: CRNA and Surgeon  Anesthesia Plan Comments: (Plan routine monitors, SAB with adductor canal block for post op analgesia)        Anesthesia Quick  Evaluation

## 2018-06-20 NOTE — Anesthesia Procedure Notes (Signed)
Spinal  Start time: 06/20/2018 9:16 AM End time: 06/20/2018 9:20 AM Staffing Resident/CRNA: Gean Maidens, CRNA Performed: resident/CRNA  Preanesthetic Checklist Completed: patient identified, site marked, surgical consent, pre-op evaluation, timeout performed, IV checked, risks and benefits discussed and monitors and equipment checked Spinal Block Patient position: sitting Prep: DuraPrep Patient monitoring: heart rate, continuous pulse ox and blood pressure Approach: midline Location: L3-4 Injection technique: single-shot Needle Needle type: Sprotte  Needle gauge: 24 G Needle length: 9 cm Needle insertion depth: 6 cm Additional Notes Pt sitting position sterile prep and drape, negative paresthesia/heme

## 2018-06-20 NOTE — Discharge Instructions (Addendum)
INSTRUCTIONS AFTER JOINT REPLACEMENT  ° °o Remove items at home which could result in a fall. This includes throw rugs or furniture in walking pathways °o ICE to the affected joint every three hours while awake for 30 minutes at a time, for at least the first 3-5 days, and then as needed for pain and swelling.  Continue to use ice for pain and swelling. You may notice swelling that will progress down to the foot and ankle.  This is normal after surgery.  Elevate your leg when you are not up walking on it.   °o Continue to use the breathing machine you got in the hospital (incentive spirometer) which will help keep your temperature down.  It is common for your temperature to cycle up and down following surgery, especially at night when you are not up moving around and exerting yourself.  The breathing machine keeps your lungs expanded and your temperature down. ° ° °DIET:  As you were doing prior to hospitalization, we recommend a well-balanced diet. ° °Information on my medicine - XARELTO® (Rivaroxaban) ° ° °Why was Xarelto® prescribed for you? °Xarelto® was prescribed for you to reduce the risk of blood clots forming after orthopedic surgery. The medical term for these abnormal blood clots is venous thromboembolism (VTE). ° °What do you need to know about xarelto® ? °Take your Xarelto® ONCE DAILY at the same time every day. °You may take it either with or without food. ° °If you have difficulty swallowing the tablet whole, you may crush it and mix in applesauce just prior to taking your dose. ° °Take Xarelto® exactly as prescribed by your doctor and DO NOT stop taking Xarelto® without talking to the doctor who prescribed the medication.  Stopping without other VTE prevention medication to take the place of Xarelto® may increase your risk of developing a clot. ° °After discharge, you should have regular check-up appointments with your healthcare provider that is prescribing your Xarelto®.   ° °What do you do if you  miss a dose? °If you miss a dose, take it as soon as you remember on the same day then continue your regularly scheduled once daily regimen the next day. Do not take two doses of Xarelto® on the same day.  ° °Important Safety Information °A possible side effect of Xarelto® is bleeding. You should call your healthcare provider right away if you experience any of the following: °? Bleeding from an injury or your nose that does not stop. °? Unusual colored urine (red or dark brown) or unusual colored stools (red or black). °? Unusual bruising for unknown reasons. °? A serious fall or if you hit your head (even if there is no bleeding). ° °Some medicines may interact with Xarelto® and might increase your risk of bleeding while on Xarelto®. To help avoid this, consult your healthcare provider or pharmacist prior to using any new prescription or non-prescription medications, including herbals, vitamins, non-steroidal anti-inflammatory drugs (NSAIDs) and supplements. ° °This website has more information on Xarelto®: www.xarelto.com. ° ° ° °DRESSING / WOUND CARE / SHOWERING ° °Keep the surgical dressing until follow up.  The dressing is water proof, so you can shower without any extra covering.  IF THE DRESSING FALLS OFF or the wound gets wet inside, change the dressing with sterile gauze.  Please use good hand washing techniques before changing the dressing.  Do not use any lotions or creams on the incision until instructed by your surgeon.   ° °ACTIVITY ° °o   Increase activity slowly as tolerated, but follow the weight bearing instructions below.   °o No driving for 6 weeks or until further direction given by your physician.  You cannot drive while taking narcotics.  °o No lifting or carrying greater than 10 lbs. until further directed by your surgeon. °o Avoid periods of inactivity such as sitting longer than an hour when not asleep. This helps prevent blood clots.  °o You may return to work once you are authorized by  your doctor.  ° ° ° °WEIGHT BEARING  ° °Weight bearing as tolerated with assist device (walker, cane, etc) as directed, use it as long as suggested by your surgeon or therapist, typically at least 4-6 weeks. ° ° °EXERCISES ° °Results after joint replacement surgery are often greatly improved when you follow the exercise, range of motion and muscle strengthening exercises prescribed by your doctor. Safety measures are also important to protect the joint from further injury. Any time any of these exercises cause you to have increased pain or swelling, decrease what you are doing until you are comfortable again and then slowly increase them. If you have problems or questions, call your caregiver or physical therapist for advice.  ° °Rehabilitation is important following a joint replacement. After just a few days of immobilization, the muscles of the leg can become weakened and shrink (atrophy).  These exercises are designed to build up the tone and strength of the thigh and leg muscles and to improve motion. Often times heat used for twenty to thirty minutes before working out will loosen up your tissues and help with improving the range of motion but do not use heat for the first two weeks following surgery (sometimes heat can increase post-operative swelling).  ° °These exercises can be done on a training (exercise) mat, on the floor, on a table or on a bed. Use whatever works the best and is most comfortable for you.    Use music or television while you are exercising so that the exercises are a pleasant break in your day. This will make your life better with the exercises acting as a break in your routine that you can look forward to.   Perform all exercises about fifteen times, three times per day or as directed.  You should exercise both the operative leg and the other leg as well. ° °Exercises include: °  °• Quad Sets - Tighten up the muscle on the front of the thigh (Quad) and hold for 5-10 seconds.    °• Straight Leg Raises - With your knee straight (if you were given a brace, keep it on), lift the leg to 60 degrees, hold for 3 seconds, and slowly lower the leg.  Perform this exercise against resistance later as your leg gets stronger.  °• Leg Slides: Lying on your back, slowly slide your foot toward your buttocks, bending your knee up off the floor (only go as far as is comfortable). Then slowly slide your foot back down until your leg is flat on the floor again.  °• Angel Wings: Lying on your back spread your legs to the side as far apart as you can without causing discomfort.  °• Hamstring Strength:  Lying on your back, push your heel against the floor with your leg straight by tightening up the muscles of your buttocks.  Repeat, but this time bend your knee to a comfortable angle, and push your heel against the floor.  You may put a pillow under the heel   to make it more comfortable if necessary.  ° °A rehabilitation program following joint replacement surgery can speed recovery and prevent re-injury in the future due to weakened muscles. Contact your doctor or a physical therapist for more information on knee rehabilitation.  ° ° °CONSTIPATION ° °Constipation is defined medically as fewer than three stools per week and severe constipation as less than one stool per week.  Even if you have a regular bowel pattern at home, your normal regimen is likely to be disrupted due to multiple reasons following surgery.  Combination of anesthesia, postoperative narcotics, change in appetite and fluid intake all can affect your bowels.  ° °YOU MUST use at least one of the following options; they are listed in order of increasing strength to get the job done.  They are all available over the counter, and you may need to use some, POSSIBLY even all of these options:   ° °Drink plenty of fluids (prune juice may be helpful) and high fiber foods °Colace 100 mg by mouth twice a day  °Senokot for constipation as directed and as  needed Dulcolax (bisacodyl), take with full glass of water  °Miralax (polyethylene glycol) once or twice a day as needed. ° °If you have tried all these things and are unable to have a bowel movement in the first 3-4 days after surgery call either your surgeon or your primary doctor.   ° °If you experience loose stools or diarrhea, hold the medications until you stool forms back up.  If your symptoms do not get better within 1 week or if they get worse, check with your doctor.  If you experience "the worst abdominal pain ever" or develop nausea or vomiting, please contact the office immediately for further recommendations for treatment. ° ° °ITCHING:  If you experience itching with your medications, try taking only a single pain pill, or even half a pain pill at a time.  You can also use Benadryl over the counter for itching or also to help with sleep.  ° °TED HOSE STOCKINGS:  Use stockings on both legs until for at least 2 weeks or as directed by physician office. They may be removed at night for sleeping. ° °MEDICATIONS:  See your medication summary on the “After Visit Summary” that nursing will review with you.  You may have some home medications which will be placed on hold until you complete the course of blood thinner medication.  It is important for you to complete the blood thinner medication as prescribed. ° °PRECAUTIONS:  If you experience chest pain or shortness of breath - call 911 immediately for transfer to the hospital emergency department.  ° °If you develop a fever greater that 101 F, purulent drainage from wound, increased redness or drainage from wound, foul odor from the wound/dressing, or calf pain - CONTACT YOUR SURGEON.   °                                                °FOLLOW-UP APPOINTMENTS:  If you do not already have a post-op appointment, please call the office for an appointment to be seen by your surgeon.  Guidelines for how soon to be seen are listed in your “After Visit Summary”, but  are typically between 1-4 weeks after surgery. ° °OTHER INSTRUCTIONS:  ° °Knee Replacement:  Do not place pillow under   knee, focus on keeping the knee straight while resting.  ° °MAKE SURE YOU:  °• Understand these instructions.  °• Get help right away if you are not doing well or get worse.  ° ° °Thank you for letting us be a part of your medical care team.  It is a privilege we respect greatly.  We hope these instructions will help you stay on track for a fast and full recovery!  °  °

## 2018-06-21 LAB — BASIC METABOLIC PANEL
Anion gap: 8 (ref 5–15)
BUN: 17 mg/dL (ref 8–23)
CO2: 25 mmol/L (ref 22–32)
Calcium: 8.3 mg/dL — ABNORMAL LOW (ref 8.9–10.3)
Chloride: 104 mmol/L (ref 98–111)
Creatinine, Ser: 0.88 mg/dL (ref 0.61–1.24)
GFR calc Af Amer: >60 mL/min (ref >60)
GFR calc non Af Amer: >60 mL/min (ref >60)
Glucose, Bld: 133 mg/dL — ABNORMAL HIGH (ref 70–99)
Potassium: 4 mmol/L (ref 3.5–5.1)
Sodium: 137 mmol/L (ref 135–145)

## 2018-06-21 LAB — CBC
HCT: 30.2 % — ABNORMAL LOW (ref 39.0–52.0)
Hemoglobin: 10 g/dL — ABNORMAL LOW (ref 13.0–17.0)
MCH: 29.9 pg (ref 26.0–34.0)
MCHC: 33.1 g/dL (ref 30.0–36.0)
MCV: 90.4 fL (ref 78.0–100.0)
Platelets: 221 10*3/uL (ref 150–400)
RBC: 3.34 MIL/uL — ABNORMAL LOW (ref 4.22–5.81)
RDW: 13.9 % (ref 11.5–15.5)
WBC: 7.1 10*3/uL (ref 4.0–10.5)

## 2018-06-21 NOTE — Progress Notes (Signed)
Patient ID: Devin Campbell, male   DOB: 1955-12-20, 62 y.o.   MRN: [id number] Subjective: 1 Day Post-Op Procedure(s) (LRB): TOTAL KNEE BILATERAL (Bilateral)    Patient reports pain as mild to moderate. No events  Objective:   VITALS:   Vitals:   06/21/18 0108 06/21/18 0514  BP: 106/77 109/71  Pulse: (!) 59 63  Resp: 16 16  Temp: (!) 97.4 F (36.3 C) (!) 97.4 F (36.3 C)  SpO2: 98% 99%    Neurovascular intact Incision: dressing C/D/I - bilateral  LABS Recent Labs    06/21/18 0620  HGB 10.0*  HCT 30.2*  WBC 7.1  PLT 221    Recent Labs    06/21/18 0620  NA 137  K 4.0  BUN 17  CREATININE 0.88  GLUCOSE 133*    No results for input(s): LABPT, INR in the last 72 hours.   Assessment/Plan: 1 Day Post-Op Procedure(s) (LRB): TOTAL KNEE BILATERAL (Bilateral)   Advance diet Up with therapy Plan for discharge tomorrow or Sunday depending on progress with therapy Reviewed goals RTC in 2 weeks Rx on chart

## 2018-06-21 NOTE — Progress Notes (Signed)
Physical Therapy Treatment Patient Details Name: Devin Campbell MRN: [id number] DOB: 1956/08/03 Today's Date: 06/21/2018    History of Present Illness Pt s/p bil TKR and with hx of syncope including 4 vasovagal episodes in 3 yrs    PT Comments    POD # 1 pm session Assisted with amb in hallway then returned to room to perform TE's followed by ICE.    Follow Up Recommendations  Follow surgeon's recommendation for DC plan and follow-up therapies     Equipment Recommendations  None recommended by PT    Recommendations for Other Services       Precautions / Restrictions Precautions Precautions: Fall Restrictions Weight Bearing Restrictions: No Other Position/Activity Restrictions: WBAT    Mobility  Bed Mobility Overal bed mobility: Needs Assistance Bed Mobility: Supine to Sit     Supine to sit: Modified independent (Device/Increase time)        Transfers Overall transfer level: Needs assistance Equipment used: Rolling walker (2 wheeled) Transfers: Sit to/from Stand Sit to Stand: Min assist;From elevated surface         General transfer comment: minor VC's for hand placement  Ambulation/Gait Ambulation/Gait assistance: Min guard Gait Distance (Feet): 120 Feet Assistive device: Rolling walker (2 wheeled) Gait Pattern/deviations: Shuffle;Trunk flexed;Step-through pattern;Decreased stride length Gait velocity: decreased   General Gait Details: mod wt bearing through the [employer] Mobility    Modified Rankin (Stroke Patients Only)       Balance                                            Cognition Arousal/Alertness: Awake/alert Behavior During Therapy: WFL for tasks assessed/performed Overall Cognitive Status: Within Functional Limits for tasks assessed                                        Exercises Total Joint Exercises Ankle Circles/Pumps: AROM;Both;20 reps;Seated Quad Sets:  AROM;Both;Seated;10 reps Towel Squeeze: AROM;Seated;10 reps Short Arc Quad: AROM;Both;5 reps;Seated Heel Slides: AROM;Both;10 reps;Seated Straight Leg Raises: AROM;Both;5 reps;Seated    General Comments        Pertinent Vitals/Pain Pain Assessment: 0-10 Pain Score: 6  Pain Location: bil knees, more pain in L knee Pain Descriptors / Indicators: Aching;Sore Pain Intervention(s): Limited activity within patient's tolerance;Repositioned;Ice applied;Monitored during session    Home Living                      Prior Function            PT Goals (current goals can now be found in the care plan section) Progress towards PT goals: Progressing toward goals    Frequency    7X/week      PT Plan      Co-evaluation              AM-PAC PT "6 Clicks" Daily Activity  Outcome Measure  Difficulty turning over in bed (including adjusting bedclothes, sheets and blankets)?: Unable Difficulty moving from lying on back to sitting on the side of the bed? : Unable Difficulty sitting down on and standing up from a chair with arms (e.g., wheelchair, bedside commode, etc,.)?: Unable Help needed moving to and from a bed  to chair (including a wheelchair)?: A Lot Help needed walking in hospital room?: A Lot Help needed climbing 3-5 steps with a railing? : Total 6 Click Score: 8    End of Session Equipment Utilized During Treatment: Gait belt Activity Tolerance: Patient tolerated treatment well Patient left: in chair;with call bell/phone within reach;with family/visitor present   PT Visit Diagnosis: Difficulty in walking, not elsewhere classified (R26.2)     Time: 1415-1440 PT Time Calculation (min) (ACUTE ONLY): 25 min  Charges:  $Gait Training: 8-22 mins $Therapeutic Exercise: 8-22 mins                    G Codes:       {Bobette Leyh  PTA [facility]  Acute  Rehab Pager      [phone]

## 2018-06-21 NOTE — Progress Notes (Addendum)
Physical Therapy Treatment Patient Details Name: Devin Campbell MRN: [id number] DOB: [birth year] Today's Date: 06/21/2018    History of Present Illness Pt s/p bil TKR and with hx of syncope including 4 vasovagal episodes in 3 yrs    PT Comments    POD #1 assisted with bed mobility, transfers, and ambulation. Patient able to go form supine to EOB under own power with use of rails. Minor VC's needed during sit to stand for hand placement; needed to rock a couple times before standing. Patient went 120 feet with a step through pattern; mod wt on RW. Conducted LE exercises with minor VC's for form. Patient tolerated treatment well and reported a slight increase in pain in the L knee. Patient's spouse was present during therapy session.   Follow Up Recommendations  Follow surgeon's recommendation for DC plan and follow-up therapies     Equipment Recommendations  None recommended by PT    Recommendations for Other Services       Precautions / Restrictions Precautions Precautions: Fall Restrictions Weight Bearing Restrictions: No Other Position/Activity Restrictions: WBAT    Mobility  Bed Mobility Overal bed mobility: Needs Assistance Bed Mobility: Supine to Sit     Supine to sit: Modified independent (Device/Increase time)        Transfers Overall transfer level: Needs assistance Equipment used: Rolling walker (2 wheeled) Transfers: Sit to/from Stand Sit to Stand: Min assist;From elevated surface         General transfer comment: minor VC's for hand placement  Ambulation/Gait Ambulation/Gait assistance: Min guard Gait Distance (Feet): 120 Feet Assistive device: Rolling walker (2 wheeled) Gait Pattern/deviations: Shuffle;Trunk flexed;Step-through pattern;Decreased stride length Gait velocity: decreased   General Gait Details: mod wt bearing through the [employer] Mobility    Modified Rankin (Stroke Patients Only)        Balance                                            Cognition Arousal/Alertness: Awake/alert Behavior During Therapy: WFL for tasks assessed/performed Overall Cognitive Status: Within Functional Limits for tasks assessed                                        Exercises Total Joint Exercises Ankle Circles/Pumps: AROM;Both;20 reps;Seated Quad Sets: AROM;Both;Seated;10 reps Towel Squeeze: AROM;Seated;10 reps Short Arc Quad: AROM;Both;5 reps;Seated Heel Slides: AROM;Both;10 reps;Seated Straight Leg Raises: AROM;Both;5 reps;Seated    General Comments        Pertinent Vitals/Pain Pain Assessment: 0-10 Pain Score: 6  Pain Location: bil knees, more pain in L knee Pain Descriptors / Indicators: Aching;Sore Pain Intervention(s): Limited activity within patient's tolerance;Repositioned;Ice applied;Monitored during session    Home Living                      Prior Function            PT Goals (current goals can now be found in the care plan section) Progress towards PT goals: Progressing toward goals    Frequency    7X/week      PT Plan      Co-evaluation  AM-PAC PT "6 Clicks" Daily Activity  Outcome Measure  Difficulty turning over in bed (including adjusting bedclothes, sheets and blankets)?: Unable Difficulty moving from lying on back to sitting on the side of the bed? : Unable Difficulty sitting down on and standing up from a chair with arms (e.g., wheelchair, bedside commode, etc,.)?: Unable Help needed moving to and from a bed to chair (including a wheelchair)?: A Lot Help needed walking in hospital room?: A Lot Help needed climbing 3-5 steps with a railing? : Total 6 Click Score: 8    End of Session Equipment Utilized During Treatment: Gait belt Activity Tolerance: Patient tolerated treatment well Patient left: in chair;with call bell/phone within reach;with family/visitor present   PT Visit  Diagnosis: Difficulty in walking, not elsewhere classified (R26.2)     Time:  -     Charges:                       G CodesRachel Bo, SPTA 06/21/2018, 1:06 PM

## 2018-06-22 LAB — BASIC METABOLIC PANEL
Anion gap: 6 (ref 5–15)
BUN: 20 mg/dL (ref 8–23)
CO2: 27 mmol/L (ref 22–32)
Calcium: 8.1 mg/dL — ABNORMAL LOW (ref 8.9–10.3)
Chloride: 106 mmol/L (ref 98–111)
Creatinine, Ser: 0.81 mg/dL (ref 0.61–1.24)
GFR calc Af Amer: >60 mL/min (ref >60)
GFR calc non Af Amer: >60 mL/min (ref >60)
Glucose, Bld: 99 mg/dL (ref 70–99)
Potassium: 4 mmol/L (ref 3.5–5.1)
Sodium: 139 mmol/L (ref 135–145)

## 2018-06-22 LAB — CBC
HCT: 27 % — ABNORMAL LOW (ref 39.0–52.0)
Hemoglobin: 9 g/dL — ABNORMAL LOW (ref 13.0–17.0)
MCH: 30.1 pg (ref 26.0–34.0)
MCHC: 33.3 g/dL (ref 30.0–36.0)
MCV: 90.3 fL (ref 78.0–100.0)
Platelets: 236 10*3/uL (ref 150–400)
RBC: 2.99 MIL/uL — ABNORMAL LOW (ref 4.22–5.81)
RDW: 14.2 % (ref 11.5–15.5)
WBC: 6.6 10*3/uL (ref 4.0–10.5)

## 2018-06-22 NOTE — Progress Notes (Signed)
Discharged from floor via w/c for transport home by car. Wife & belongings with pt. No changes in assessment. Cienna Dumais, [employer]

## 2018-06-22 NOTE — Progress Notes (Signed)
Subjective: 2 Days Post-Op Procedure(s) (LRB): TOTAL KNEE BILATERAL (Bilateral) Patient reports pain as mild and moderate.   Reports doing well and feels ready to go home. No CP, SOB, fever, chills, N/V, numbness, tingling. Voiding without difficulty. No BM yet but positive flatus.   Objective: Vital signs in last 24 hours: Temp:  [97.5 F (36.4 C)-97.8 F (36.6 C)] 97.8 F (36.6 C) (07/20 0604) Pulse Rate:  [71-83] 72 (07/20 0604) Resp:  [16] 16 (07/20 0604) BP: (115-136)/(74-86) 115/74 (07/20 0604) SpO2:  [97 %-99 %] 98 % (07/20 0604)  Intake/Output from previous day: 07/19 0701 - 07/20 0700 In: 1603.8 [P.O.:1080; I.V.:523.8] Out: 300 [Urine:300] Intake/Output this shift: No intake/output data recorded.  Recent Labs    06/21/18 0620 06/22/18 0455  HGB 10.0* 9.0*   Recent Labs    06/21/18 0620 06/22/18 0455  WBC 7.1 6.6  RBC 3.34* 2.99*  HCT 30.2* 27.0*  PLT 221 236   Recent Labs    06/21/18 0620 06/22/18 0455  NA 137 139  K 4.0 4.0  CL 104 106  CO2 25 27  BUN 17 20  CREATININE 0.88 0.81  GLUCOSE 133* 99  CALCIUM 8.3* 8.1*   No results for input(s): LABPT, INR in the last 72 hours.  Neurologically intact ABD soft Neurovascular intact Sensation intact distally Intact pulses distally Dorsiflexion/Plantar flexion intact Incision: dressing C/D/I and no drainage No cellulitis present Compartment soft no calf pain or sign of DVT  Anticipated LOS equal to or greater than 2 midnights due to - Age 50 and older with one or more of the following:  - Obesity  - Expected need for hospital services (PT, OT, Nursing) required for safe  discharge  - Anticipated need for postoperative skilled nursing care or inpatient rehab  - Active co-morbidities: bilateral TKAs   Assessment/Plan: 2 Days Post-Op Procedure(s) (LRB): TOTAL KNEE BILATERAL (Bilateral) Advance diet Up with therapy D/C IV fluids Discussed D/C instructions Rx on front of chart D/C home  today  BISSELL, JACLYN M. 06/22/2018, 9:51 AM

## 2018-06-22 NOTE — Progress Notes (Signed)
Physical Therapy Treatment Patient Details Name: Devin Campbell MRN: [id number] DOB: Jul 15, 1956 Today's Date: 06/22/2018    History of Present Illness Pt s/p bil TKR and with hx of syncope including 4 vasovagal episodes in 3 yrs    PT Comments    Pt progressing well with mobility and eager for dc home.  Reviewed home therex and up/down single step.  Written home program provided.  Pt for OP PT 06/24/18   Follow Up Recommendations  Follow surgeon's recommendation for DC plan and follow-up therapies     Equipment Recommendations  None recommended by PT    Recommendations for Other Services OT consult     Precautions / Restrictions Precautions Precautions: Fall Restrictions Weight Bearing Restrictions: No Other Position/Activity Restrictions: WBAT    Mobility  Bed Mobility Overal bed mobility: Needs Assistance Bed Mobility: Supine to Sit     Supine to sit: Modified independent (Device/Increase time)        Transfers Overall transfer level: Needs assistance Equipment used: Rolling walker (2 wheeled) Transfers: Sit to/from Stand Sit to Stand: Min guard;From elevated surface         General transfer comment: minor VC's for hand placement  Ambulation/Gait Ambulation/Gait assistance: Min Gaffer (Feet): 250 Feet Assistive device: Rolling walker (2 wheeled) Gait Pattern/deviations: Shuffle;Trunk flexed;Step-through pattern;Decreased stride length Gait velocity: decreased   General Gait Details: mod wt bearing through the [employer] Stairs: Yes Stairs assistance: Min assist Stair Management: No rails;Backwards;With walker;Step to pattern Number of Stairs: 2 General stair comments: single step twice to use stool to get into high bed   Wheelchair Mobility    Modified Rankin (Stroke Patients Only)       Balance                                            Cognition Arousal/Alertness: Awake/alert Behavior  During Therapy: WFL for tasks assessed/performed Overall Cognitive Status: Within Functional Limits for tasks assessed                                        Exercises Total Joint Exercises Ankle Circles/Pumps: AROM;Both;20 reps;Supine Quad Sets: AROM;Both;15 reps;Supine Heel Slides: Both;AAROM;15 reps;Supine Hip ABduction/ADduction: AROM;10 reps;Supine;Both Straight Leg Raises: Both;AAROM;AROM;20 reps;Supine Long Arc Quad: AAROM;AROM;Both;10 reps;Seated Goniometric ROM: AAROM bil knees -10 - 90    General Comments        Pertinent Vitals/Pain Pain Assessment: 0-10 Pain Score: 6  Pain Location: bil knees, more pain in R knee Pain Descriptors / Indicators: Aching;Sore Pain Intervention(s): Limited activity within patient's tolerance;Monitored during session;Premedicated before session;Ice applied    Home Living                      Prior Function            PT Goals (current goals can now be found in the care plan section) Acute Rehab PT Goals Patient Stated Goal: Regain IND PT Goal Formulation: With patient Time For Goal Achievement: 06/27/18 Potential to Achieve Goals: Good Progress towards PT goals: Progressing toward goals    Frequency    7X/week      PT Plan Current plan remains appropriate    Co-evaluation  AM-PAC PT "6 Clicks" Daily Activity  Outcome Measure  Difficulty turning over in bed (including adjusting bedclothes, sheets and blankets)?: A Lot Difficulty moving from lying on back to sitting on the side of the bed? : A Lot Difficulty sitting down on and standing up from a chair with arms (e.g., wheelchair, bedside commode, etc,.)?: A Lot Help needed moving to and from a bed to chair (including a wheelchair)?: A Little Help needed walking in hospital room?: A Little Help needed climbing 3-5 steps with a railing? : A Little 6 Click Score: 15    End of Session Equipment Utilized During Treatment: Gait  belt Activity Tolerance: Patient tolerated treatment well Patient left: in chair;with call bell/phone within reach;with family/visitor present Nurse Communication: Mobility status PT Visit Diagnosis: Difficulty in walking, not elsewhere classified (R26.2)     Time: 0810-0910 PT Time Calculation (min) (ACUTE ONLY): 60 min  Charges:  $Gait Training: 23-37 mins $Therapeutic Exercise: 8-22 mins                    G Codes:       Pg [phone]    Raquon Milledge 06/22/2018, 11:27 AM

## 2018-06-22 NOTE — Discharge Summary (Signed)
Physician Discharge Summary   Patient ID: Devin Campbell MRN: [id number] DOB/AGE: [birth year] 62 y.o.  Admit date: 06/20/2018 Discharge date:   Primary Diagnosis: bilateral knee primary osteoarthritis  Admission Diagnoses:  Past Medical History:  Diagnosis Date  . Arthritis   . Complication of anesthesia    once had a hard time waking up after colonoscopy  . Grade I diastolic dysfunction   . Hypertension   . Nocturnal leg cramps   . Syncope 06/2015   Vasovagalx4 in 3 years  . Trace tricuspid valve regurgitation    Discharge Diagnoses:   Principal Problem:   S/P bilateral TKAs  Estimated body mass index is 26.58 kg/m as calculated from the following:   Height as of this encounter: 5' 9" (1.753 m).   Weight as of this encounter: 81.6 kg (180 lb).  Procedure:  Procedure(s) (LRB): TOTAL KNEE BILATERAL (Bilateral)   Consults: None  HPI: see H&P Laboratory Data: Admission on 06/20/2018  Component Date Value Ref Range Status  . WBC 06/21/2018 7.1  4.0 - 10.5 K/uL Final  . RBC 06/21/2018 3.34* 4.22 - 5.81 MIL/uL Final  . Hemoglobin 06/21/2018 10.0* 13.0 - 17.0 g/dL Final  . HCT 06/21/2018 30.2* 39.0 - 52.0 % Final  . MCV 06/21/2018 90.4  78.0 - 100.0 fL Final  . MCH 06/21/2018 29.9  26.0 - 34.0 pg Final  . MCHC 06/21/2018 33.1  30.0 - 36.0 g/dL Final  . RDW 06/21/2018 13.9  11.5 - 15.5 % Final  . Platelets 06/21/2018 221  150 - 400 K/uL Final   Performed at [facility], [place] [address] [place], [place] [address]  . Sodium 06/21/2018 137  135 - 145 mmol/L Final  . Potassium 06/21/2018 4.0  3.5 - 5.1 mmol/L Final  . Chloride 06/21/2018 104  98 - 111 mmol/L Final   Please note change in reference range.  . CO2 06/21/2018 25  22 - 32 mmol/L Final  . Glucose, Bld 06/21/2018 133* 70 - 99 mg/dL Final   Please note change in reference range.  . BUN 06/21/2018 17  8 - 23 mg/dL Final   Please note change in reference range.  . Creatinine, Ser 06/21/2018  0.88  0.61 - 1.24 mg/dL Final  . Calcium 06/21/2018 8.3* 8.9 - 10.3 mg/dL Final  . GFR calc non Af Amer 06/21/2018 >60  >60 mL/min Final  . GFR calc Af Amer 06/21/2018 >60  >60 mL/min Final   Comment: (NOTE) The eGFR has been calculated using the CKD EPI equation. This calculation has not been validated in all clinical situations. eGFR's persistently <60 mL/min signify possible Chronic Kidney Disease.   Georgiann Hahn gap 06/21/2018 8  5 - 15 Final   Performed at [facility], [place] [address] [place], [place] [address]  . WBC 06/22/2018 6.6  4.0 - 10.5 K/uL Final  . RBC 06/22/2018 2.99* 4.22 - 5.81 MIL/uL Final  . Hemoglobin 06/22/2018 9.0* 13.0 - 17.0 g/dL Final  . HCT 06/22/2018 27.0* 39.0 - 52.0 % Final  . MCV 06/22/2018 90.3  78.0 - 100.0 fL Final  . MCH 06/22/2018 30.1  26.0 - 34.0 pg Final  . MCHC 06/22/2018 33.3  30.0 - 36.0 g/dL Final  . RDW 06/22/2018 14.2  11.5 - 15.5 % Final  . Platelets 06/22/2018 236  150 - 400 K/uL Final   Performed at [facility], [place] [address], [place], [place] [address]  . Sodium 06/22/2018 139  135 -  145 mmol/L Final  . Potassium 06/22/2018 4.0  3.5 - 5.1 mmol/L Final  . Chloride 06/22/2018 106  98 - 111 mmol/L Final   Please note change in reference range.  . CO2 06/22/2018 27  22 - 32 mmol/L Final  . Glucose, Bld 06/22/2018 99  70 - 99 mg/dL Final   Please note change in reference range.  . BUN 06/22/2018 20  8 - 23 mg/dL Final   Please note change in reference range.  . Creatinine, Ser 06/22/2018 0.81  0.61 - 1.24 mg/dL Final  . Calcium 06/22/2018 8.1* 8.9 - 10.3 mg/dL Final  . GFR calc non Af Amer 06/22/2018 >60  >60 mL/min Final  . GFR calc Af Amer 06/22/2018 >60  >60 mL/min Final   Comment: (NOTE) The eGFR has been calculated using the CKD EPI equation. This calculation has not been validated in all clinical situations. eGFR's persistently <60 mL/min signify possible Chronic Kidney Disease.   Georgiann Hahn  gap 06/22/2018 6  5 - 15 Final   Performed at [facility], [place] [address] [place], [place] [address]  Hospital Outpatient Visit on 06/11/2018  Component Date Value Ref Range Status  . ABO/RH(D) 06/11/2018 O POS   Final  . Antibody Screen 06/11/2018 NEG   Final  . Sample Expiration 06/11/2018 06/23/2018   Final  . Extend sample reason 06/11/2018    Final                   Value:NO TRANSFUSIONS OR PREGNANCY IN THE PAST 3 MONTHS Performed at [facility], [place] [address], [place], [place] [address]   . MRSA, PCR 06/11/2018 NEGATIVE  NEGATIVE Final  . Staphylococcus aureus 06/11/2018 NEGATIVE  NEGATIVE Final   Comment: (NOTE) The Xpert SA Assay (FDA approved for NASAL specimens in patients 55 years of age and older), is one component of a comprehensive surveillance program. It is not intended to diagnose infection nor to guide or monitor treatment. Performed at [facility], [place] [address], [place], [place] [address]   . Sodium 06/11/2018 142  135 - 145 mmol/L Final  . Potassium 06/11/2018 4.2  3.5 - 5.1 mmol/L Final  . Chloride 06/11/2018 109  98 - 111 mmol/L Final   Please note change in reference range.  . CO2 06/11/2018 25  22 - 32 mmol/L Final  . Glucose, Bld 06/11/2018 95  70 - 99 mg/dL Final   Please note change in reference range.  . BUN 06/11/2018 22  8 - 23 mg/dL Final   Please note change in reference range.  . Creatinine, Ser 06/11/2018 1.03  0.61 - 1.24 mg/dL Final  . Calcium 06/11/2018 9.1  8.9 - 10.3 mg/dL Final  . GFR calc non Af Amer 06/11/2018 >60  >60 mL/min Final  . GFR calc Af Amer 06/11/2018 >60  >60 mL/min Final   Comment: (NOTE) The eGFR has been calculated using the CKD EPI equation. This calculation has not been validated in all clinical situations. eGFR's persistently <60 mL/min signify possible Chronic Kidney Disease.   Georgiann Hahn gap 06/11/2018 8  5 - 15 Final   Performed at [facility], [place] [address], [place], [place] [address]  . WBC 06/11/2018 3.9* 4.0 - 10.5 K/uL Final  . RBC 06/11/2018 4.24  4.22 - 5.81 MIL/uL Final  . Hemoglobin 06/11/2018 12.7* 13.0 - 17.0 g/dL Final  . HCT 06/11/2018 37.6* 39.0 - 52.0 % Final  . MCV  06/11/2018 88.7  78.0 - 100.0 fL Final  . MCH 06/11/2018 30.0  26.0 - 34.0 pg Final  . MCHC 06/11/2018 33.8  30.0 - 36.0 g/dL Final  . RDW 06/11/2018 13.7  11.5 - 15.5 % Final  . Platelets 06/11/2018 272  150 - 400 K/uL Final   Performed at [facility], [place] [address], [place], [place] [address]  . ABO/RH(D) 06/11/2018    Final                   Value:O POS Performed at [facility], [place] [address], [place], [place] [address]      X-Rays:No results found.  EKG: Orders placed or performed during the hospital encounter of 06/11/18  . EKG 12 lead  . EKG 12 lead     Hospital Course: JOYCE LECKEY is a 62 y.o. who was admitted to [facility]. They were brought to the operating room on 06/20/2018 and underwent Procedure(s): TOTAL KNEE BILATERAL.  Patient tolerated the procedure well and was later transferred to the recovery room and then to the orthopaedic floor for postoperative care.  They were given PO and IV analgesics for pain control following their surgery.  They were given 24 hours of postoperative antibiotics of  Anti-infectives (From admission, onward)   Start     Dose/Rate Route Frequency Ordered Stop   06/20/18 1600  ceFAZolin (ANCEF) IVPB 2g/100 mL premix     2 g 200 mL/hr over 30 Minutes Intravenous Every 6 hours 06/20/18 1324 06/20/18 2229   06/20/18 0645  ceFAZolin (ANCEF) IVPB 2g/100 mL premix     2 g 200 mL/hr over 30 Minutes Intravenous On call to O.R. 06/20/18 2025 06/20/18 1003     and started on DVT prophylaxis in the form of Xarelto, TED hose and SCDs.   PT and OT were ordered for total joint protocol.  Discharge planning consulted to help with postop disposition and  equipment needs.  Patient had a good night on the evening of surgery.  They started to get up OOB with therapy on day one.  Continued to work with therapy into day two.  By day three, the patient had progressed with therapy and meeting their goals.  Incision was healing well.  Patient was seen in rounds and was ready to go home on POD2.   Diet: Regular diet Activity:WBAT Follow-up:in 10-14 days Disposition - Home Discharged Condition: good   Discharge Instructions    Call MD / Call 911   Complete by:  As directed    If you experience chest pain or shortness of breath, CALL 911 and be transported to the hospital emergency room.  If you develope a fever above 101 F, pus (white drainage) or increased drainage or redness at the wound, or calf pain, call your surgeon's office.   Call MD / Call 911   Complete by:  As directed    If you experience chest pain or shortness of breath, CALL 911 and be transported to the hospital emergency room.  If you develope a fever above 101 F, pus (white drainage) or increased drainage or redness at the wound, or calf pain, call your surgeon's office.   Change dressing   Complete by:  As directed    Maintain surgical dressing until follow up in the clinic. If the edges start to pull up, may reinforce with tape. If the dressing is no longer working, may remove and cover with gauze and tape,  but must keep the area dry and clean.  Call with any questions or concerns.   Constipation Prevention   Complete by:  As directed    Drink plenty of fluids.  Prune juice may be helpful.  You may use a stool softener, such as Colace (over the counter) 100 mg twice a day.  Use MiraLax (over the counter) for constipation as needed.   Constipation Prevention   Complete by:  As directed    Drink plenty of fluids.  Prune juice may be helpful.  You may use a stool softener, such as Colace (over the counter) 100 mg twice a day.  Use MiraLax (over the counter) for constipation as needed.    Diet - low sodium heart healthy   Complete by:  As directed    Diet - low sodium heart healthy   Complete by:  As directed    Discharge instructions   Complete by:  As directed    Maintain surgical dressing until follow up in the clinic. If the edges start to pull up, may reinforce with tape. If the dressing is no longer working, may remove and cover with gauze and tape, but must keep the area dry and clean.  Follow up in 2 weeks at [facility]. Call with any questions or concerns.   Increase activity slowly as tolerated   Complete by:  As directed    Weight bearing as tolerated with assist device (walker, cane, etc) as directed, use it as long as suggested by your surgeon or therapist, typically at least 4-6 weeks.   Increase activity slowly as tolerated   Complete by:  As directed    TED hose   Complete by:  As directed    Use stockings (TED hose) for 2 weeks on both leg(s).  You may remove them at night for sleeping.     Allergies as of 06/22/2018   No Known Allergies     Medication List    TAKE these medications   acetaminophen 500 MG tablet Commonly known as:  TYLENOL Take 2 tablets (1,000 mg total) by mouth every 8 (eight) hours.   aspirin 81 MG chewable tablet Commonly known as:  ASPIRIN CHILDRENS Chew 1 tablet (81 mg total) by mouth 2 (two) times daily. Start the day after finishing Xarelto. Take for 4 weeks, then resume regular dose. Start taking on:  07/07/2018   docusate sodium 100 MG capsule Commonly known as:  COLACE Take 1 capsule (100 mg total) by mouth 2 (two) times daily.   ferrous sulfate 325 (65 FE) MG tablet Commonly known as:  FERROUSUL Take 1 tablet (325 mg total) by mouth 3 (three) times daily with meals.   Fish Oil 1200 MG Caps Take 1,200 mg by mouth daily.   lisinopril 20 MG tablet Commonly known as:  PRINIVIL,ZESTRIL Take 20 mg by mouth daily.   methocarbamol 500 MG tablet Commonly known as:  ROBAXIN Take 1 tablet (500 mg total)  by mouth every 6 (six) hours as needed for muscle spasms.   oxyCODONE 5 MG immediate release tablet Commonly known as:  Oxy IR/ROXICODONE Take 1-2 tablets (5-10 mg total) by mouth every 4 (four) hours as needed for moderate pain or severe pain.   polyethylene glycol packet Commonly known as:  MIRALAX / GLYCOLAX Take 17 g by mouth 2 (two) times daily.   rivaroxaban 10 MG Tabs tablet Commonly known as:  XARELTO Take 1 tablet (10 mg total) by mouth daily for 14 days.  Discharge Care Instructions  (From admission, onward)        Start     Ordered   06/21/18 0000  Change dressing    Comments:  Maintain surgical dressing until follow up in the clinic. If the edges start to pull up, may reinforce with tape. If the dressing is no longer working, may remove and cover with gauze and tape, but must keep the area dry and clean.  Call with any questions or concerns.   06/21/18 1400     Follow-up Information    Paralee Cancel, MD. Schedule an appointment as soon as possible for a visit in 2 weeks.   Specialty:  Orthopedic Surgery Contact information: [address] [place] [address] [phone]           Signed: Lacie Draft PA-C Orthopaedic Surgery 06/22/2018, 9:54 AM

## 2020-01-16 ENCOUNTER — Ambulatory Visit: Payer: Self-pay

## 2020-01-31 ENCOUNTER — Ambulatory Visit: Payer: Self-pay

## 2020-02-01 ENCOUNTER — Ambulatory Visit: Payer: Self-pay | Attending: Internal Medicine

## 2020-02-01 DIAGNOSIS — Z23 Encounter for immunization: Secondary | ICD-10-CM | POA: Insufficient documentation

## 2020-02-01 NOTE — Progress Notes (Signed)
   Covid-19 Vaccination Clinic  Name:  Devin Campbell    MRN: [id number] DOB: 1956/11/21  02/01/2020  Mr. Scherzinger was observed post Covid-19 immunization for 15 minutes without incidence. He was provided with Vaccine Information Sheet and instruction to access the V-Safe system.   Mr. Jank was instructed to call 911 with any severe reactions post vaccine: [name] Kitchen Difficulty breathing  . Swelling of your face and throat  . A fast heartbeat  . A bad rash all over your body  . Dizziness and weakness    Immunizations Administered    Name Date Dose VIS Date Route   Pfizer COVID-19 Vaccine 02/01/2020 11:39 AM 0.3 mL 11/14/2019 Intramuscular   Manufacturer: [place]   Lot: [id number]   [place]: [id number]

## 2020-03-02 ENCOUNTER — Ambulatory Visit: Payer: Self-pay | Attending: Internal Medicine

## 2020-03-02 DIAGNOSIS — Z23 Encounter for immunization: Secondary | ICD-10-CM

## 2020-03-02 NOTE — Progress Notes (Signed)
   Covid-19 Vaccination Clinic  Name:  Devin Campbell    MRN: [id number] DOB: January 31, 1956  03/02/2020  [name] was observed post Covid-19 immunization for 15 minutes without incident. He was provided with Vaccine Information Sheet and instruction to access the V-Safe system.   [name] was instructed to call 911 with any severe reactions post vaccine: [name] Kitchen Difficulty breathing  . Swelling of face and throat  . A fast heartbeat  . A bad rash all over body  . Dizziness and weakness   Immunizations Administered    Name Date Dose VIS Date Route   Pfizer COVID-19 Vaccine 03/02/2020 10:49 AM 0.3 mL 11/14/2019 Intramuscular   Manufacturer: [place]   Lot: [id number]   [place]: [id number]

## 2020-08-05 ENCOUNTER — Ambulatory Visit: Payer: Self-pay | Admitting: Cardiology

## 2020-08-20 ENCOUNTER — Other Ambulatory Visit: Payer: Self-pay

## 2020-08-20 ENCOUNTER — Encounter: Payer: Self-pay | Admitting: Cardiology

## 2020-08-20 ENCOUNTER — Ambulatory Visit: Payer: Medicare Other | Admitting: Cardiology

## 2020-08-20 VITALS — Ht 69.0 in | Wt 198.0 lb

## 2020-08-20 DIAGNOSIS — E782 Mixed hyperlipidemia: Secondary | ICD-10-CM

## 2020-08-20 DIAGNOSIS — R55 Syncope and collapse: Secondary | ICD-10-CM

## 2020-08-20 NOTE — Progress Notes (Signed)
 Follow up visit  Subjective:   Devin Campbell, male    DOB: 03/18/1956, 64 y.o.   MRN: [id number]   HPI  Chief Complaint  Patient presents with  . Loss of Consciousness  . Follow-up    64 y.o. Caucasian male with hypertension, recurrent syncope  Patient was last seen by me in January 2019 with episodes of recurrent syncope.  At that time, episodes were related with urination or defecation.  There were felt most likely to be vasovagal in nature.  Echocardiogram showed structurally normal heart.  Patient had an episode few days ago, he was lying semi-recumbent on a recliner at around 5 PM. He has worked outside, hadn't had anything to eat since lunch but had drank some gatorade. He had sudden onset of diaphoresis, without chest pain or shortness of breath. He says his wife told him he lost consciousness for no more than 30-40 sec. EMS arrived, but did not find anything abnormal on initial evaluation. He has had similar episdoes in the past, sometimes associated with urination. Episodes occur no more than 1-2 times/year.   Current Outpatient Medications on File Prior to Visit  Medication Sig Dispense Refill  . acetaminophen (TYLENOL) 500 MG tablet Take 2 tablets (1,000 mg total) by mouth every 8 (eight) hours. 30 tablet 0  . docusate sodium (COLACE) 100 MG capsule Take 1 capsule (100 mg total) by mouth 2 (two) times daily. 10 capsule 0  . ferrous sulfate (FERROUSUL) 325 (65 FE) MG tablet Take 1 tablet (325 mg total) by mouth 3 (three) times daily with meals.  3  . lisinopril (PRINIVIL,ZESTRIL) 20 MG tablet Take 20 mg by mouth daily.    . methocarbamol (ROBAXIN) 500 MG tablet Take 1 tablet (500 mg total) by mouth every 6 (six) hours as needed for muscle spasms. 40 tablet 0  . Omega-3 Fatty Acids (FISH OIL) 1200 MG CAPS Take 1,200 mg by mouth daily.    . oxyCODONE (OXY IR/ROXICODONE) 5 MG immediate release tablet Take 1-2 tablets (5-10 mg total) by mouth every 4 (four) hours as needed for  moderate pain or severe pain. 60 tablet 0  . polyethylene glycol (MIRALAX / GLYCOLAX) packet Take 17 g by mouth 2 (two) times daily. 14 each 0  . rivaroxaban (XARELTO) 10 MG TABS tablet Take 1 tablet (10 mg total) by mouth daily for 14 days. 14 tablet 0   No current facility-administered medications on file prior to visit.    Cardiovascular & other pertient studies:  EKG 08/20/2020: Sinus rhythm 78 bpm Nonspecific T-abnormality  Echocardiogram 08/2018: EF 55%.  Normal study  Recent labs: 05/12/2020: Glucose 92, BUN/Cr 22/1.0. EGFR 78. Chol 187, TG 62, HDL 47, LDL 128 TSH 1.7 normal    Review of Systems  Cardiovascular: Positive for syncope. Negative for chest pain, dyspnea on exertion, leg swelling and palpitations.         Vitals:   08/20/20 1236 08/20/20 1237  SpO2: 97% 96%    Body mass index is 29.24 kg/m. Filed Weights   08/20/20 1228  Weight: 198 lb (89.8 kg)   Orthostatic VS for the past 72 hrs (Last 3 readings):  Orthostatic BP Patient Position BP Location Cuff Size Orthostatic Pulse  08/20/20 1237 115/81 Standing Left Arm Normal 79  08/20/20 1236 121/83 Sitting Left Arm Normal 81  08/20/20 1228 125/80 Supine Left Arm Normal 84      Objective:   Physical Exam Vitals and nursing note reviewed.  Constitutional:        General: He is not in acute distress. Neck:     Vascular: No JVD.  Cardiovascular:     Rate and Rhythm: Normal rate and regular rhythm.     Heart sounds: Normal heart sounds. No murmur heard.   Pulmonary:     Effort: Pulmonary effort is normal.     Breath sounds: Normal breath sounds. No wheezing or rales.           Assessment & Recommendations:   64 y.o. Caucasian male with hypertension, recurrent syncope  Syncope: Probably vasovagal. Normal EKG, physical exam. Normal echocardiogram in 2019 obtained for similar symptoms. Recommended liberal hydration, compression stocking. Discussed counter-pressure maneuvers.    Hyperlipidemia: Wants to hold off statin therapy at his time.   F/u in 3 months    Nigel Mormon, MD Pager: [phone] Office: [phone]

## 2020-11-05 ENCOUNTER — Ambulatory Visit: Payer: Medicare Other | Attending: Internal Medicine

## 2020-11-05 DIAGNOSIS — Z23 Encounter for immunization: Secondary | ICD-10-CM

## 2020-11-05 NOTE — Progress Notes (Signed)
   Covid-19 Vaccination Clinic  Name:  Devin Campbell    MRN: [id number] DOB: 07-11-1956  11/05/2020  Mr. [name] was observed post Covid-19 immunization for 15 minutes without incident. He was provided with Vaccine Information Sheet and instruction to access the V-Safe system.   Mr. [name] was instructed to call 911 with any severe reactions post vaccine: [name] Kitchen Difficulty breathing  . Swelling of face and throat  . A fast heartbeat  . A bad rash all over body  . Dizziness and weakness   Immunizations Administered    Name Date Dose VIS Date Route   Pfizer COVID-19 Vaccine 11/05/2020  3:31 PM 0.3 mL 09/22/2020 Intramuscular   Manufacturer: [place]   Lot: [id number]   [place]: [address]-7

## 2020-11-19 ENCOUNTER — Ambulatory Visit: Payer: Self-pay

## 2020-11-24 ENCOUNTER — Other Ambulatory Visit: Payer: Self-pay

## 2020-11-24 ENCOUNTER — Inpatient Hospital Stay: Payer: Medicare Other

## 2020-11-24 ENCOUNTER — Ambulatory Visit: Payer: Medicare Other | Admitting: Cardiology

## 2020-11-24 ENCOUNTER — Encounter: Payer: Self-pay | Admitting: Cardiology

## 2020-11-24 VITALS — Resp 16 | Ht 69.0 in | Wt 197.0 lb

## 2020-11-24 DIAGNOSIS — E782 Mixed hyperlipidemia: Secondary | ICD-10-CM

## 2020-11-24 DIAGNOSIS — R55 Syncope and collapse: Secondary | ICD-10-CM

## 2020-11-24 DIAGNOSIS — R002 Palpitations: Secondary | ICD-10-CM

## 2020-11-24 NOTE — Progress Notes (Signed)
Follow up visit  Subjective:   Devin Campbell, male    DOB: 08/03/1956, 64 y.o.   MRN: [id number]   HPI  Chief Complaint  Patient presents with  . Syncope and collapse  . Follow-up    64 y.o. Caucasian male with hypertension, recurrent syncope   After his last visit with me, he has had at least 1-2 episodes of palpitations lasting for a few minutes.  He has not had any syncope, although had at least one episode of lightheadedness and near syncope.  He wonders if he has any arrhythmia.  He is currently on lisinopril 20 mg daily.  Orthostatics checked below.  He does not have any dizziness symptoms at this time.  OV 08/2020: Patient was last seen by me in January 2019 with episodes of recurrent syncope.  At that time, episodes were related with urination or defecation.  There were felt most likely to be vasovagal in nature.  Echocardiogram showed structurally normal heart.  Patient had an episode few days ago, he was lying semi-recumbent on a recliner at around 5 PM. He has worked outside, hadn't had anything to eat since lunch but had drank some gatorade. He had sudden onset of diaphoresis, without chest pain or shortness of breath. He says his wife told him he lost consciousness for no more than 30-40 sec. EMS arrived, but did not find anything abnormal on initial evaluation. He has had similar episdoes in the past, sometimes associated with urination. Episodes occur no more than 1-2 times/year.   Current Outpatient Medications on File Prior to Visit  Medication Sig Dispense Refill  . lisinopril (PRINIVIL,ZESTRIL) 20 MG tablet Take 20 mg by mouth daily.     No current facility-administered medications on file prior to visit.    Cardiovascular & other pertient studies:  EKG 08/20/2020: Sinus rhythm 78 bpm Nonspecific T-abnormality  Echocardiogram 08/2018: EF 55%.  Normal study  Recent labs: 05/12/2020: Glucose 92, BUN/Cr 22/1.0. EGFR 78. Chol 187, TG 62, HDL 47, LDL  128 TSH 1.7 normal    Review of Systems  Cardiovascular: Positive for syncope. Negative for chest pain, dyspnea on exertion, leg swelling and palpitations.        Orthostatic VS for the past 72 hrs (Last 3 readings):  Orthostatic BP Patient Position BP Location Cuff Size Orthostatic Pulse  11/24/20 0928 115/86 Standing Left Arm Normal 106  11/24/20 0927 129/83 Sitting Left Arm Normal 100  11/24/20 0922 135/86 Supine Left Arm Normal 100     Vitals:   11/24/20 0927 11/24/20 0928  Resp:    SpO2: 97% 96%    Body mass index is 29.09 kg/m. Filed Weights   11/24/20 0922  Weight: 197 lb (89.4 kg)   No data found.    Objective:   Physical Exam Vitals and nursing note reviewed.  Constitutional:      General: He is not in acute distress. Neck:     Vascular: No JVD.  Cardiovascular:     Rate and Rhythm: Normal rate and regular rhythm.     Heart sounds: Normal heart sounds. No murmur heard.   Pulmonary:     Effort: Pulmonary effort is normal.     Breath sounds: Normal breath sounds. No wheezing or rales.           Assessment & Recommendations:   64 y.o. Caucasian male with hypertension, recurrent syncope  Syncope: Probably vasovagal. Normal EKG, physical exam. Normal echocardiogram in 2019. Given his episodes of palpitations, arrhythmias are  possibility.  Will place on 2-week cardiac telemetry.  Orthostatics mildly positive today, albeit without symptoms.  Recommended liberal hydration, compression stocking. Discussed counter-pressure maneuvers.   Mixed hyperlipidemia: Wants to hold off statin therapy at his time.   F/u in 3 months    Nigel Mormon, MD Pager: [phone] Office: [phone]

## 2021-02-08 NOTE — Progress Notes (Signed)
   Follow up visit  Subjective:   Devin Campbell, male    DOB: [birth year], 65 y.o.   MRN: [id number]   HPI  Chief Complaint  Patient presents with  . Syncope and collapse  . Follow-up    65 y.o. Caucasian male with hypertension, recurrent syncope  He ha snot had any recurrent syncope symptoms. He has episodes of profuse sweating at night at times. He denies loss of appetite or weight.   Current Outpatient Medications on File Prior to Visit  Medication Sig Dispense Refill  . lisinopril (PRINIVIL,ZESTRIL) 20 MG tablet Take 20 mg by mouth daily.     No current facility-administered medications on file prior to visit.    Cardiovascular & other pertient studies:  EKG 02/09/2021: Sinus rhythm 74 bpm  Normal EKG  Mobile cardiac telemetry 13 days 11/24/2020 - 12/08/2020: Dominant rhythm: Sinus. HR 41-148 bpm. Avg HR 75 bpm, in sinus rhythm 2 episodes of SVT, fastest and longest at 194 bpm for 6 beats <1% isolated SVE, couplet/triplets. <1% isolated VE, couplet/triplets. No atrial fibrillation/atrial flutter/VT/high grade AV block, sinus pause >3sec noted. 0 patient triggered events.     Echocardiogram 08/2018: EF 55%.  Normal study  Recent labs: 05/12/2020: Glucose 92, BUN/Cr 22/1.0. EGFR 78. Chol 187, TG 62, HDL 47, LDL 128 TSH 1.7 normal    Review of Systems  Cardiovascular: Positive for syncope. Negative for chest pain, dyspnea on exertion, leg swelling and palpitations.         Vitals:   02/09/21 1139  BP: 140/87  Pulse: 71  Resp: 16  Temp: 98.2 F (36.8 C)    Body mass index is 28.97 kg/m. Filed Weights   02/09/21 1139  Weight: 196 lb 3.2 oz (89 kg)      Objective:   Physical Exam Vitals and nursing note reviewed.  Constitutional:      General: He is not in acute distress. Neck:     Vascular: No JVD.  Cardiovascular:     Rate and Rhythm: Normal rate and regular rhythm.     Heart sounds: Normal heart sounds. No murmur heard.   Pulmonary:      Effort: Pulmonary effort is normal.     Breath sounds: Normal breath sounds. No wheezing or rales.           Assessment & Recommendations:   65 y.o. Caucasian male with hypertension, recurrent syncope  Syncope: Most likely vasovagal. Normal EKG, physical exam. Normal echocardiogram in 2019.No signifciant arrhythmia on cardiac telemetry (01/2021) ecommended liberal hydration, compression stocking. Discussed counter-pressure maneuvers.   Mixed hyperlipidemia: Wants to hold off statin therapy at his time.   Nigh sweats: No obvious abnormality to suggest malignancy. Recommend f.u w/PCP  F/u in 1 year   Nigel Mormon, MD Pager: [phone] Office: [phone]

## 2021-02-09 ENCOUNTER — Encounter: Payer: Self-pay | Admitting: Cardiology

## 2021-02-09 ENCOUNTER — Other Ambulatory Visit: Payer: Self-pay

## 2021-02-09 ENCOUNTER — Ambulatory Visit: Payer: Medicare Other | Admitting: Cardiology

## 2021-02-09 VITALS — BP 140/87 | HR 71 | Temp 98.2°F | Resp 16 | Ht 69.0 in | Wt 196.2 lb

## 2021-02-09 DIAGNOSIS — R55 Syncope and collapse: Secondary | ICD-10-CM

## 2021-02-09 DIAGNOSIS — R61 Generalized hyperhidrosis: Secondary | ICD-10-CM

## 2021-06-13 DIAGNOSIS — E559 Vitamin D deficiency, unspecified: Secondary | ICD-10-CM | POA: Diagnosis not present

## 2021-06-13 DIAGNOSIS — D649 Anemia, unspecified: Secondary | ICD-10-CM | POA: Diagnosis not present

## 2021-06-13 DIAGNOSIS — I10 Essential (primary) hypertension: Secondary | ICD-10-CM | POA: Diagnosis not present

## 2021-06-13 DIAGNOSIS — Z0000 Encounter for general adult medical examination without abnormal findings: Secondary | ICD-10-CM | POA: Diagnosis not present

## 2021-06-21 DIAGNOSIS — Z0000 Encounter for general adult medical examination without abnormal findings: Secondary | ICD-10-CM | POA: Diagnosis not present

## 2021-06-21 DIAGNOSIS — R7303 Prediabetes: Secondary | ICD-10-CM | POA: Diagnosis not present

## 2021-06-21 DIAGNOSIS — E559 Vitamin D deficiency, unspecified: Secondary | ICD-10-CM | POA: Diagnosis not present

## 2021-06-21 DIAGNOSIS — E7800 Pure hypercholesterolemia, unspecified: Secondary | ICD-10-CM | POA: Diagnosis not present

## 2021-06-21 DIAGNOSIS — H6123 Impacted cerumen, bilateral: Secondary | ICD-10-CM | POA: Diagnosis not present

## 2021-06-21 DIAGNOSIS — Z23 Encounter for immunization: Secondary | ICD-10-CM | POA: Diagnosis not present

## 2021-06-21 DIAGNOSIS — I10 Essential (primary) hypertension: Secondary | ICD-10-CM | POA: Diagnosis not present

## 2021-10-17 ENCOUNTER — Ambulatory Visit: Payer: Medicare Other | Admitting: Dermatology

## 2021-10-17 ENCOUNTER — Other Ambulatory Visit: Payer: Self-pay

## 2021-10-17 DIAGNOSIS — D3615 Benign neoplasm of peripheral nerves and autonomic nervous system of abdomen: Secondary | ICD-10-CM | POA: Diagnosis not present

## 2021-10-17 DIAGNOSIS — L720 Epidermal cyst: Secondary | ICD-10-CM

## 2021-10-17 DIAGNOSIS — D2339 Other benign neoplasm of skin of other parts of face: Secondary | ICD-10-CM | POA: Diagnosis not present

## 2021-10-17 DIAGNOSIS — D1801 Hemangioma of skin and subcutaneous tissue: Secondary | ICD-10-CM

## 2021-10-17 DIAGNOSIS — D239 Other benign neoplasm of skin, unspecified: Secondary | ICD-10-CM

## 2021-10-17 DIAGNOSIS — Z1283 Encounter for screening for malignant neoplasm of skin: Secondary | ICD-10-CM | POA: Diagnosis not present

## 2021-10-17 DIAGNOSIS — D3610 Benign neoplasm of peripheral nerves and autonomic nervous system, unspecified: Secondary | ICD-10-CM

## 2021-10-17 DIAGNOSIS — L918 Other hypertrophic disorders of the skin: Secondary | ICD-10-CM

## 2021-11-07 ENCOUNTER — Encounter: Payer: Self-pay | Admitting: Dermatology

## 2021-11-07 NOTE — Progress Notes (Signed)
   New Patient   Subjective  Devin Campbell is a 65 y.o. male who presents for the following: New Patient (Initial Visit) (Here for mole exam. No concerns. No history of skin cancers. ).  General skin examination Location:  Duration:  Quality:  Associated Signs/Symptoms: Modifying Factors:  Severity:  Timing: Context:    The following portions of the chart were reviewed this encounter and updated as appropriate:  Tobacco  Allergies  Meds  Problems  Med Hx  Surg Hx  Fam Hx      Objective  Well appearing patient in no apparent distress; mood and affect are within normal limits. Mid Back Full body skin exam.  No atypical pigmented lesions or nonmelanoma skin cancer.  Left Zygomatic Area 3 mm vesicular-like papule left outer canthus  Mid Back Open cyst.  Noninflamed 1 cm dermal nodule  Mid Back Multiple 2 mm red smooth dermal papules  Left Axilla, Right Axilla Fleshy, skin-colored  pedunculated papules.    Right Abdomen (side) - Upper Soft pink 6 mm dermal papule    A full examination was performed including scalp, head, eyes, ears, nose, lips, neck, chest, axillae, abdomen, back, buttocks, bilateral upper extremities, bilateral lower extremities, hands, feet, fingers, toes, fingernails, and toenails. All findings within normal limits unless otherwise noted below.   Assessment & Plan  Screening for malignant neoplasm of skin Mid Back  Yearly skin exams.  Self examine with spouse twice annually.  Hydrocystoma Left Zygomatic Area  May leave if clinically stable.  I explained the challenge of removing these but patient may decide on conservative surgery.  Epidermal cyst Mid Back  Leave if stable  Hemangioma of skin Mid Back  No intervention necessary  Skin tag (2) Left Axilla; Right Axilla  Patient may elect to have these removed in future  Neurofibroma Right Abdomen (side) - Upper  Leave if stable

## 2022-02-09 ENCOUNTER — Ambulatory Visit: Payer: Medicare Other | Admitting: Cardiology

## 2022-06-16 DIAGNOSIS — E7800 Pure hypercholesterolemia, unspecified: Secondary | ICD-10-CM | POA: Diagnosis not present

## 2022-06-16 DIAGNOSIS — R7303 Prediabetes: Secondary | ICD-10-CM | POA: Diagnosis not present

## 2022-06-21 DIAGNOSIS — E7800 Pure hypercholesterolemia, unspecified: Secondary | ICD-10-CM | POA: Diagnosis not present

## 2022-06-21 DIAGNOSIS — R7303 Prediabetes: Secondary | ICD-10-CM | POA: Diagnosis not present

## 2022-06-21 DIAGNOSIS — E559 Vitamin D deficiency, unspecified: Secondary | ICD-10-CM | POA: Diagnosis not present

## 2022-06-28 DIAGNOSIS — E559 Vitamin D deficiency, unspecified: Secondary | ICD-10-CM | POA: Diagnosis not present

## 2022-06-28 DIAGNOSIS — Z0000 Encounter for general adult medical examination without abnormal findings: Secondary | ICD-10-CM | POA: Diagnosis not present

## 2022-06-28 DIAGNOSIS — I10 Essential (primary) hypertension: Secondary | ICD-10-CM | POA: Diagnosis not present

## 2022-06-28 DIAGNOSIS — E7800 Pure hypercholesterolemia, unspecified: Secondary | ICD-10-CM | POA: Diagnosis not present

## 2022-06-28 DIAGNOSIS — R7303 Prediabetes: Secondary | ICD-10-CM | POA: Diagnosis not present

## 2022-10-17 ENCOUNTER — Ambulatory Visit: Payer: Medicare Other | Admitting: Dermatology

## 2022-12-13 DIAGNOSIS — M542 Cervicalgia: Secondary | ICD-10-CM | POA: Diagnosis not present

## 2022-12-13 DIAGNOSIS — I10 Essential (primary) hypertension: Secondary | ICD-10-CM | POA: Diagnosis not present

## 2022-12-13 DIAGNOSIS — M25511 Pain in right shoulder: Secondary | ICD-10-CM | POA: Diagnosis not present

## 2022-12-28 DIAGNOSIS — M542 Cervicalgia: Secondary | ICD-10-CM | POA: Insufficient documentation

## 2023-01-11 DIAGNOSIS — M5412 Radiculopathy, cervical region: Secondary | ICD-10-CM | POA: Diagnosis not present

## 2023-01-11 DIAGNOSIS — M542 Cervicalgia: Secondary | ICD-10-CM | POA: Diagnosis not present

## 2023-01-11 DIAGNOSIS — M79601 Pain in right arm: Secondary | ICD-10-CM | POA: Diagnosis not present

## 2023-01-15 DIAGNOSIS — M79601 Pain in right arm: Secondary | ICD-10-CM | POA: Diagnosis not present

## 2023-01-15 DIAGNOSIS — M5412 Radiculopathy, cervical region: Secondary | ICD-10-CM | POA: Diagnosis not present

## 2023-01-15 DIAGNOSIS — M542 Cervicalgia: Secondary | ICD-10-CM | POA: Diagnosis not present

## 2023-01-25 DIAGNOSIS — M5412 Radiculopathy, cervical region: Secondary | ICD-10-CM | POA: Diagnosis not present

## 2023-01-25 DIAGNOSIS — M542 Cervicalgia: Secondary | ICD-10-CM | POA: Diagnosis not present

## 2023-01-25 DIAGNOSIS — M79601 Pain in right arm: Secondary | ICD-10-CM | POA: Diagnosis not present

## 2023-01-30 DIAGNOSIS — M5412 Radiculopathy, cervical region: Secondary | ICD-10-CM | POA: Diagnosis not present

## 2023-01-30 DIAGNOSIS — M542 Cervicalgia: Secondary | ICD-10-CM | POA: Diagnosis not present

## 2023-01-30 DIAGNOSIS — M79601 Pain in right arm: Secondary | ICD-10-CM | POA: Diagnosis not present

## 2023-03-27 DIAGNOSIS — I10 Essential (primary) hypertension: Secondary | ICD-10-CM | POA: Diagnosis not present

## 2023-03-27 DIAGNOSIS — H6123 Impacted cerumen, bilateral: Secondary | ICD-10-CM | POA: Diagnosis not present

## 2023-03-27 DIAGNOSIS — H9313 Tinnitus, bilateral: Secondary | ICD-10-CM | POA: Diagnosis not present

## 2023-05-28 DIAGNOSIS — K219 Gastro-esophageal reflux disease without esophagitis: Secondary | ICD-10-CM | POA: Diagnosis not present

## 2023-05-28 DIAGNOSIS — I10 Essential (primary) hypertension: Secondary | ICD-10-CM | POA: Diagnosis not present

## 2023-05-28 DIAGNOSIS — Z86010 Personal history of colonic polyps: Secondary | ICD-10-CM | POA: Diagnosis not present

## 2023-06-01 DIAGNOSIS — L821 Other seborrheic keratosis: Secondary | ICD-10-CM | POA: Diagnosis not present

## 2023-06-01 DIAGNOSIS — L578 Other skin changes due to chronic exposure to nonionizing radiation: Secondary | ICD-10-CM | POA: Diagnosis not present

## 2023-06-01 DIAGNOSIS — D235 Other benign neoplasm of skin of trunk: Secondary | ICD-10-CM | POA: Diagnosis not present

## 2023-06-01 DIAGNOSIS — L814 Other melanin hyperpigmentation: Secondary | ICD-10-CM | POA: Diagnosis not present

## 2023-06-01 DIAGNOSIS — D229 Melanocytic nevi, unspecified: Secondary | ICD-10-CM | POA: Diagnosis not present

## 2023-06-01 DIAGNOSIS — D692 Other nonthrombocytopenic purpura: Secondary | ICD-10-CM | POA: Diagnosis not present

## 2023-06-12 DIAGNOSIS — R12 Heartburn: Secondary | ICD-10-CM | POA: Diagnosis not present

## 2023-06-12 DIAGNOSIS — Z86010 Personal history of colonic polyps: Secondary | ICD-10-CM | POA: Diagnosis not present

## 2023-06-12 DIAGNOSIS — D123 Benign neoplasm of transverse colon: Secondary | ICD-10-CM | POA: Diagnosis not present

## 2023-06-12 DIAGNOSIS — K635 Polyp of colon: Secondary | ICD-10-CM | POA: Diagnosis not present

## 2023-06-12 DIAGNOSIS — K222 Esophageal obstruction: Secondary | ICD-10-CM | POA: Diagnosis not present

## 2023-06-12 DIAGNOSIS — K317 Polyp of stomach and duodenum: Secondary | ICD-10-CM | POA: Diagnosis not present

## 2023-06-12 DIAGNOSIS — D132 Benign neoplasm of duodenum: Secondary | ICD-10-CM | POA: Diagnosis not present

## 2023-06-12 DIAGNOSIS — K5730 Diverticulosis of large intestine without perforation or abscess without bleeding: Secondary | ICD-10-CM | POA: Diagnosis not present

## 2023-06-12 DIAGNOSIS — K449 Diaphragmatic hernia without obstruction or gangrene: Secondary | ICD-10-CM | POA: Diagnosis not present

## 2023-06-12 DIAGNOSIS — D124 Benign neoplasm of descending colon: Secondary | ICD-10-CM | POA: Diagnosis not present

## 2023-06-12 DIAGNOSIS — K3189 Other diseases of stomach and duodenum: Secondary | ICD-10-CM | POA: Diagnosis not present

## 2023-06-12 DIAGNOSIS — Q394 Esophageal web: Secondary | ICD-10-CM | POA: Diagnosis not present

## 2023-06-25 DIAGNOSIS — R7303 Prediabetes: Secondary | ICD-10-CM | POA: Diagnosis not present

## 2023-06-25 DIAGNOSIS — Z0000 Encounter for general adult medical examination without abnormal findings: Secondary | ICD-10-CM | POA: Diagnosis not present

## 2023-06-25 DIAGNOSIS — E7800 Pure hypercholesterolemia, unspecified: Secondary | ICD-10-CM | POA: Diagnosis not present

## 2023-06-25 DIAGNOSIS — E559 Vitamin D deficiency, unspecified: Secondary | ICD-10-CM | POA: Diagnosis not present

## 2023-06-25 DIAGNOSIS — D649 Anemia, unspecified: Secondary | ICD-10-CM | POA: Diagnosis not present

## 2023-06-27 DIAGNOSIS — H53143 Visual discomfort, bilateral: Secondary | ICD-10-CM | POA: Diagnosis not present

## 2023-06-27 DIAGNOSIS — H524 Presbyopia: Secondary | ICD-10-CM | POA: Diagnosis not present

## 2023-07-02 DIAGNOSIS — R109 Unspecified abdominal pain: Secondary | ICD-10-CM | POA: Diagnosis not present

## 2023-07-02 DIAGNOSIS — R7303 Prediabetes: Secondary | ICD-10-CM | POA: Diagnosis not present

## 2023-07-02 DIAGNOSIS — E559 Vitamin D deficiency, unspecified: Secondary | ICD-10-CM | POA: Diagnosis not present

## 2023-07-02 DIAGNOSIS — I10 Essential (primary) hypertension: Secondary | ICD-10-CM | POA: Diagnosis not present

## 2023-07-02 DIAGNOSIS — E7800 Pure hypercholesterolemia, unspecified: Secondary | ICD-10-CM | POA: Diagnosis not present

## 2023-07-02 DIAGNOSIS — Z0000 Encounter for general adult medical examination without abnormal findings: Secondary | ICD-10-CM | POA: Diagnosis not present

## 2023-08-10 ENCOUNTER — Ambulatory Visit
Admission: EM | Admit: 2023-08-10 | Discharge: 2023-08-10 | Disposition: A | Discharge status: Home / Self Care | Payer: Medicare Other

## 2023-08-10 DIAGNOSIS — L309 Dermatitis, unspecified: Secondary | ICD-10-CM | POA: Diagnosis not present

## 2023-08-10 MED ORDER — DEXAMETHASONE SODIUM PHOSPHATE 10 MG/ML IJ SOLN
10.0000 mg | Freq: Once | INTRAMUSCULAR | Status: AC
  Administered 2023-08-10: 10 mg via INTRAMUSCULAR

## 2023-08-10 NOTE — ED Provider Notes (Signed)
[facility]    CSN: [id number] Arrival date & time: 08/10/23  0856      History   Chief Complaint Chief Complaint  Patient presents with   Rash    HPI Devin Campbell is a 67 y.o. male.   Patient here today for evaluation of rash to both of his legs that he first noticed last night.  He states that he had mowed outside yesterday on a riding lawnmower and rash developed shortly after.  He denies that rash is significantly itchy.  He has not had any shortness of breath or trouble swallowing.  Rash does not cross the line of clothing as he was wearing shorts.  He has tried Benadryl without resolution.  The history is provided by the patient.    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 Authorizing Provider  losartan (COZAAR) 50 MG tablet Take 50 mg by mouth daily.   Yes [provider]  pantoprazole (PROTONIX) 40 MG tablet Take 40 mg by mouth daily.   Yes [provider]  lisinopril (PRINIVIL,ZESTRIL) 20 MG tablet Take 20 mg by mouth daily.    [provider]    Family History Family History  Problem Relation Age of Onset   Dementia Mother    Heart attack Father     Social History Social History   Tobacco Use   Smoking status: Never   Smokeless tobacco: Never  Vaping Use   Vaping status: Never Used  Substance Use Topics   Alcohol use: No    Comment: socially   Drug use: No     Allergies   Patient has no known allergies.   Review of Systems Review of Systems  Constitutional:  Negative for chills and fever.  HENT:  Negative for trouble swallowing.   Eyes:  Negative for discharge and redness.  Respiratory:  Negative for shortness of breath.   Skin:  Positive for rash.  Neurological:  Negative for numbness.     Physical Exam Triage Vital Signs ED Triage Vitals  Encounter Vitals Group     BP      Systolic BP Percentile      Diastolic BP Percentile      Pulse      Resp      Temp      Temp src      SpO2      Weight  Height      Head Circumference      Peak Flow      Pain Score      Pain Loc      Pain Education      Exclude from Growth Chart    No data found.  Updated Vital Signs BP (!) 152/88 (BP Location: Left Arm)   Pulse (!) 44   Temp 98.3 F (36.8 C)   Resp 17   Wt 194 lb (88 kg)   SpO2 93%   BMI 28.65 kg/m      Physical Exam Vitals and nursing note reviewed.  Constitutional:      General: He is not in acute distress.    Appearance: Normal appearance. He is not ill-appearing.  HENT:     Head: Normocephalic and atraumatic.  Eyes:     Conjunctiva/sclera: Conjunctivae normal.  Cardiovascular:     Rate and Rhythm: Normal rate.  Pulmonary:     Effort: Pulmonary effort is normal. No respiratory distress.  Skin:    Comments: Diffusely scattered mildly erythematous maculopapular eruption to bilateral lower legs spreading to mid thigh.  Neurological:     Mental Status: He is alert.  Psychiatric:        Mood and Affect: Mood normal.        Behavior: Behavior normal.         Thought Content: Thought content normal.      UC Treatments / Results  Labs (all labs ordered are listed, but only abnormal results are displayed) Labs Reviewed - No data to display  EKG   Radiology No results found.  Procedures Procedures (including critical care time)  Medications Ordered in UC Medications  dexamethasone (DECADRON) injection 10 mg (has no administration in time range)    Initial Impression / Assessment and Plan / UC Course  I have reviewed the triage vital signs and the nursing notes.  Pertinent labs & imaging results that were available during my care of the patient were reviewed by me and considered in my medical decision making (see chart for details).    Dexamethasone injection administered in office.  Recommended follow-up if no gradual improvement with any further concerns.  Final Clinical Impressions(s) / UC Diagnoses   Final diagnoses:  Dermatitis   Discharge Instructions   None    ED Prescriptions   None    PDMP not reviewed this encounter.   Tomi Bamberger, PA-C 08/10/23 0930

## 2023-08-10 NOTE — ED Triage Notes (Signed)
Rash that he noticed on both legs last night. Doesn't really itch.

## 2023-09-25 DIAGNOSIS — H2513 Age-related nuclear cataract, bilateral: Secondary | ICD-10-CM | POA: Diagnosis not present

## 2023-09-25 DIAGNOSIS — H18413 Arcus senilis, bilateral: Secondary | ICD-10-CM | POA: Diagnosis not present

## 2023-09-25 DIAGNOSIS — H2512 Age-related nuclear cataract, left eye: Secondary | ICD-10-CM | POA: Diagnosis not present

## 2023-09-25 DIAGNOSIS — H25013 Cortical age-related cataract, bilateral: Secondary | ICD-10-CM | POA: Diagnosis not present

## 2023-09-25 DIAGNOSIS — H25043 Posterior subcapsular polar age-related cataract, bilateral: Secondary | ICD-10-CM | POA: Diagnosis not present

## 2023-10-30 DIAGNOSIS — I999 Unspecified disorder of circulatory system: Secondary | ICD-10-CM | POA: Diagnosis not present

## 2023-10-30 DIAGNOSIS — I10 Essential (primary) hypertension: Secondary | ICD-10-CM | POA: Diagnosis not present

## 2024-04-23 ENCOUNTER — Encounter: Payer: Self-pay | Admitting: Emergency Medicine

## 2024-04-23 ENCOUNTER — Ambulatory Visit: Admission: EM | Admit: 2024-04-23 | Discharge: 2024-04-23 | Disposition: A | Discharge status: Home / Self Care

## 2024-04-23 DIAGNOSIS — Z1211 Encounter for screening for malignant neoplasm of colon: Secondary | ICD-10-CM | POA: Insufficient documentation

## 2024-04-23 DIAGNOSIS — J069 Acute upper respiratory infection, unspecified: Secondary | ICD-10-CM

## 2024-04-23 DIAGNOSIS — R051 Acute cough: Secondary | ICD-10-CM | POA: Diagnosis not present

## 2024-04-23 DIAGNOSIS — Z860100 Personal history of colon polyps, unspecified: Secondary | ICD-10-CM | POA: Insufficient documentation

## 2024-04-23 MED ORDER — AMOXICILLIN-POT CLAVULANATE 875-125 MG PO TABS: 1.0000 | ORAL_TABLET | Freq: Two times a day (BID) | ORAL | 0 refills | Status: AC

## 2024-04-23 NOTE — ED Triage Notes (Signed)
 Pt presents c/o cough, nasal congestion, and sore throat x 5 days. Pt denies fever and emesis.

## 2024-04-23 NOTE — ED Provider Notes (Signed)
 [facility]    CSN: [id number] Arrival date & time: 04/23/24  1011      History   Chief Complaint Chief Complaint  Patient presents with   Nasal Congestion   Sore Throat   Cough    HPI Devin Campbell is a 68 y.o. male.   Patient presents requesting evaluation for 6 day history of nasal congestion, headache, maxillary and frontal sinus discomfort, and the new onset of right eye watering.  Patient states his symptoms started with a sore throat.  He also has a reductive cough that is now waking him up at night.  Patient has taken Mucinex without any alleviation of the symptoms.  No reported fever, chest pain, shortness of breath, abdominal pain, vomiting, or diarrhea.  The history is provided by the patient.  Sore Throat Associated symptoms include headaches. Pertinent negatives include no chest pain and no abdominal pain.  Cough Associated symptoms: eye discharge, headaches and sore throat   Associated symptoms: no chest pain, no chills, no ear pain, no fever, no myalgias and no rash     Past Medical History:  Diagnosis Date   Arthritis    Complication of anesthesia    once had a hard time waking up after colonoscopy   Grade I diastolic dysfunction    Hypertension    Nocturnal leg cramps    Syncope 06/2015   Vasovagalx4 in 3 years   Trace tricuspid valve regurgitation     Patient Active Problem List   Diagnosis Date Noted   Screening for malignant neoplasm of colon 04/23/2024   History of colonic polyps 04/23/2024   Cervical radiculopathy 01/25/2023   Neck pain 12/28/2022   Night sweats 02/09/2021   Palpitations 11/24/2020   Mixed hyperlipidemia 08/20/2020   Syncope and collapse 08/20/2020   History of total knee arthroplasty 06/20/2018   Osteoarthritis of left knee 04/04/2018   Osteoarthritis of right knee 04/04/2018   Pain in right knee 12/12/2017    Past Surgical History:  Procedure Laterality Date   APPENDECTOMY     COLONOSCOPY  2012    HERNIA REPAIR     KNEE ARTHROSCOPY Left    TONSILLECTOMY     TOTAL KNEE ARTHROPLASTY Bilateral 06/20/2018   Procedure: TOTAL KNEE BILATERAL;  Surgeon: Claiborne Crew, MD;  Location: [facility];  Service: Orthopedics;  Laterality: Bilateral;  2.5 hrs       Home Medications    Prior to Admission medications   Medication Sig Start Date End Date Taking? Authorizing Provider  amLODipine (NORVASC) 5 MG tablet  12/03/23  Yes [provider]  amoxicillin -clavulanate (AUGMENTIN ) 875-125 MG tablet Take 1 tablet by mouth every 12 (twelve) hours. 04/23/24  Yes Genene Kennel, FNP  Sod Picosulfate-Mag Ox-Cit Acd (CLENPIQ) 10-3.5-12 MG-GM -GM/175ML SOLN 175 mLs. 05/28/23  Yes [provider]  amoxicillin  (AMOXIL ) 500 MG capsule TAKE 4 CAPSULES BY MOUTH 1 HOUR PRIOR TO APPOINTMENT    [provider]  gabapentin (NEURONTIN) 300 MG capsule TAKE 1 CAPSULE BY MOUTH EVERY DAY AT BEDTIME FOR 20 DAYS.    [provider]  lisinopril (PRINIVIL,ZESTRIL) 20 MG tablet Take 20 mg by mouth daily.    [provider]  losartan (COZAAR) 50 MG tablet Take 50 mg by mouth daily.    [provider]  pantoprazole (PROTONIX) 40 MG tablet Take 40 mg by mouth daily.    [provider]    Family History Family History  Problem Relation Age of Onset  Dementia Mother    Heart attack Father     Social History Social History   Tobacco Use   Smoking status: Never    Passive exposure: Never   Smokeless tobacco: Never  Vaping Use   Vaping status: Never Used  Substance Use Topics   Alcohol use: No    Comment: socially   Drug use: No     Allergies   Patient has no known allergies.   Review of Systems Review of Systems  Constitutional:  Negative for chills, fatigue and fever.  HENT:  Positive for congestion, sinus pressure, sinus pain and sore throat. Negative for ear pain.   Eyes:  Positive for discharge. Negative for pain and redness.       Right   Respiratory:  Positive for cough.   Cardiovascular:  Negative for chest pain and palpitations.  Gastrointestinal:  Negative for abdominal pain, diarrhea and vomiting.  Genitourinary:  Negative for dysuria.  Musculoskeletal:  Negative for back pain and myalgias.  Skin:  Negative for rash.  Neurological:  Positive for headaches. Negative for dizziness.     Physical Exam Triage Vital Signs ED Triage Vitals  Encounter Vitals Group     BP 04/23/24 1140 139/81     Systolic BP Percentile --      Diastolic BP Percentile --      Pulse Rate 04/23/24 1140 82     Resp 04/23/24 1140 18     Temp 04/23/24 1140 97.9 F (36.6 C)     Temp Source 04/23/24 1140 Oral     SpO2 04/23/24 1140 97 %     Weight 04/23/24 1139 194 lb 0.1 oz (88 kg)     Height --      Head Circumference --      Peak Flow --      Pain Score 04/23/24 1139 0     Pain Loc --      Pain Education --      Exclude from Growth Chart --    No data found.  Updated Vital Signs BP 139/81 (BP Location: Left Arm)   Pulse 82   Temp 97.9 F (36.6 C) (Oral)   Resp 18   Wt 194 lb 0.1 oz (88 kg)   SpO2 97%   BMI 28.65 kg/m    Physical Exam Vitals and nursing note reviewed.  Constitutional:      Appearance: He is well-developed.  HENT:     Head:     Comments: Diffuse frontal/maxillary sinus discomfort.    Right Ear: There is impacted cerumen.     Left Ear: There is impacted cerumen.     Nose: Congestion present.     Mouth/Throat:     Mouth: Mucous membranes are moist.     Pharynx: No posterior oropharyngeal erythema.  Eyes:     Conjunctiva/sclera: Conjunctivae normal.     Pupils: Pupils are equal, round, and reactive to light.  Cardiovascular:     Rate and Rhythm: Normal rate and regular rhythm.     Heart sounds: Normal heart sounds.  Pulmonary:     Effort: Pulmonary effort is normal.     Breath sounds: Normal breath sounds.  Abdominal:     General: Bowel sounds are normal.  Skin:    General: Skin is warm and  dry.  Neurological:     General: No focal deficit present.     Mental Status: He is alert and oriented to person, place, and time.  Psychiatric:  Mood and Affect: Mood normal.        Behavior: Behavior normal.        Thought Content: Thought content normal.        Judgment: Judgment normal.    UC Treatments / Results  Labs (all labs ordered are listed, but only abnormal results are displayed) Labs Reviewed - No data to display  EKG   Radiology No results found.  Procedures Procedures (including critical care time)  Medications Ordered in UC Medications - No data to display  Initial Impression / Assessment and Plan / UC Course  I have reviewed the triage vital signs and the nursing notes.  Pertinent labs & imaging results that were available during my care of the patient were reviewed by me and considered in my medical decision making (see chart for details).    Patient presents for evaluation of a 6-day history of nasal congestion, sinus pressure, and a productive cough.  Based upon age and duration of symptoms, reasonable to provide antibiotic therapy at this time.  Also discussed the use of Coricidin HBP cough and cold.  No acute indication for chest x-ray based upon today's physical examination.  Ensure adequate rest and oral hydration.  Final Clinical Impressions(s) / UC Diagnoses   Final diagnoses:  Acute upper respiratory infection  Acute cough   Discharge Instructions      An antibiotic has been prescribed to help alleviate your upper respiratory infection. May take Coricidin HBP Cough/Cold for additional symptom management.  ED Prescriptions     Medication Sig Dispense Auth. Provider   amoxicillin -clavulanate (AUGMENTIN ) 875-125 MG tablet Take 1 tablet by mouth every 12 (twelve) hours. 14 tablet Genene Kennel, FNP      PDMP not reviewed this encounter.   Genene Kennel, FNP 04/23/24 [phone]

## 2024-04-23 NOTE — Discharge Instructions (Addendum)
 An antibiotic has been prescribed to help alleviate your upper respiratory infection. May take Coricidin HBP Cough/Cold for additional symptom management.

## 2024-04-29 DIAGNOSIS — Z01818 Encounter for other preprocedural examination: Secondary | ICD-10-CM | POA: Diagnosis not present

## 2024-04-29 DIAGNOSIS — H0234 Blepharochalasis left upper eyelid: Secondary | ICD-10-CM | POA: Diagnosis not present

## 2024-04-29 DIAGNOSIS — H0235 Blepharochalasis left lower eyelid: Secondary | ICD-10-CM | POA: Diagnosis not present

## 2024-04-29 DIAGNOSIS — H0232 Blepharochalasis right lower eyelid: Secondary | ICD-10-CM | POA: Diagnosis not present

## 2024-04-29 DIAGNOSIS — H04123 Dry eye syndrome of bilateral lacrimal glands: Secondary | ICD-10-CM | POA: Diagnosis not present

## 2024-04-29 DIAGNOSIS — D485 Neoplasm of uncertain behavior of skin: Secondary | ICD-10-CM | POA: Diagnosis not present

## 2024-04-29 DIAGNOSIS — H0231 Blepharochalasis right upper eyelid: Secondary | ICD-10-CM | POA: Diagnosis not present

## 2024-04-29 DIAGNOSIS — H0279 Other degenerative disorders of eyelid and periocular area: Secondary | ICD-10-CM | POA: Diagnosis not present

## 2024-04-30 DIAGNOSIS — L02212 Cutaneous abscess of back [any part, except buttock]: Secondary | ICD-10-CM | POA: Diagnosis not present

## 2024-06-10 DIAGNOSIS — L739 Follicular disorder, unspecified: Secondary | ICD-10-CM | POA: Diagnosis not present

## 2024-06-10 DIAGNOSIS — H0279 Other degenerative disorders of eyelid and periocular area: Secondary | ICD-10-CM | POA: Diagnosis not present

## 2024-06-10 DIAGNOSIS — H0231 Blepharochalasis right upper eyelid: Secondary | ICD-10-CM | POA: Diagnosis not present

## 2024-06-10 DIAGNOSIS — L820 Inflamed seborrheic keratosis: Secondary | ICD-10-CM | POA: Diagnosis not present

## 2024-06-10 DIAGNOSIS — H0232 Blepharochalasis right lower eyelid: Secondary | ICD-10-CM | POA: Diagnosis not present

## 2024-06-10 DIAGNOSIS — H0234 Blepharochalasis left upper eyelid: Secondary | ICD-10-CM | POA: Diagnosis not present

## 2024-06-10 DIAGNOSIS — H04123 Dry eye syndrome of bilateral lacrimal glands: Secondary | ICD-10-CM | POA: Diagnosis not present

## 2024-06-10 DIAGNOSIS — D485 Neoplasm of uncertain behavior of skin: Secondary | ICD-10-CM | POA: Diagnosis not present

## 2024-06-10 DIAGNOSIS — D22111 Melanocytic nevi of right upper eyelid, including canthus: Secondary | ICD-10-CM | POA: Diagnosis not present

## 2024-06-10 DIAGNOSIS — H0235 Blepharochalasis left lower eyelid: Secondary | ICD-10-CM | POA: Diagnosis not present

## 2024-06-18 DIAGNOSIS — L821 Other seborrheic keratosis: Secondary | ICD-10-CM | POA: Diagnosis not present

## 2024-06-18 DIAGNOSIS — D1801 Hemangioma of skin and subcutaneous tissue: Secondary | ICD-10-CM | POA: Diagnosis not present

## 2024-06-18 DIAGNOSIS — L578 Other skin changes due to chronic exposure to nonionizing radiation: Secondary | ICD-10-CM | POA: Diagnosis not present

## 2024-06-18 DIAGNOSIS — L814 Other melanin hyperpigmentation: Secondary | ICD-10-CM | POA: Diagnosis not present

## 2024-06-18 DIAGNOSIS — D235 Other benign neoplasm of skin of trunk: Secondary | ICD-10-CM | POA: Diagnosis not present

## 2024-06-23 DIAGNOSIS — R7303 Prediabetes: Secondary | ICD-10-CM | POA: Diagnosis not present

## 2024-06-23 DIAGNOSIS — I10 Essential (primary) hypertension: Secondary | ICD-10-CM | POA: Diagnosis not present

## 2024-06-23 DIAGNOSIS — E559 Vitamin D deficiency, unspecified: Secondary | ICD-10-CM | POA: Diagnosis not present

## 2024-06-23 DIAGNOSIS — E7800 Pure hypercholesterolemia, unspecified: Secondary | ICD-10-CM | POA: Diagnosis not present

## 2024-07-07 DIAGNOSIS — R7303 Prediabetes: Secondary | ICD-10-CM | POA: Diagnosis not present

## 2024-07-07 DIAGNOSIS — Z0000 Encounter for general adult medical examination without abnormal findings: Secondary | ICD-10-CM | POA: Diagnosis not present

## 2024-07-07 DIAGNOSIS — E7800 Pure hypercholesterolemia, unspecified: Secondary | ICD-10-CM | POA: Diagnosis not present

## 2024-07-07 DIAGNOSIS — H6123 Impacted cerumen, bilateral: Secondary | ICD-10-CM | POA: Diagnosis not present

## 2024-07-07 DIAGNOSIS — E559 Vitamin D deficiency, unspecified: Secondary | ICD-10-CM | POA: Diagnosis not present

## 2024-07-07 DIAGNOSIS — I10 Essential (primary) hypertension: Secondary | ICD-10-CM | POA: Diagnosis not present

## 2024-09-26 DIAGNOSIS — M542 Cervicalgia: Secondary | ICD-10-CM | POA: Diagnosis not present

## 2024-09-26 DIAGNOSIS — M5412 Radiculopathy, cervical region: Secondary | ICD-10-CM | POA: Diagnosis not present
